# Patient Record
Sex: Male | Born: 1960 | Race: Black or African American | Hispanic: No | Marital: Single | Smoking: Never smoker
Health system: Southern US, Community
[De-identification: ages and names within clinical notes are randomized; demographics above are authoritative.]

## PROBLEM LIST (undated history)

## (undated) DIAGNOSIS — N186 End stage renal disease: Secondary | ICD-10-CM

## (undated) DIAGNOSIS — J9610 Chronic respiratory failure, unspecified whether with hypoxia or hypercapnia: Secondary | ICD-10-CM

## (undated) DIAGNOSIS — U071 COVID-19: Secondary | ICD-10-CM

## (undated) DIAGNOSIS — J1282 Pneumonia due to coronavirus disease 2019: Secondary | ICD-10-CM

## (undated) DIAGNOSIS — G4733 Obstructive sleep apnea (adult) (pediatric): Secondary | ICD-10-CM

## (undated) DIAGNOSIS — D649 Anemia, unspecified: Secondary | ICD-10-CM

## (undated) DIAGNOSIS — E119 Type 2 diabetes mellitus without complications: Secondary | ICD-10-CM

## (undated) DIAGNOSIS — M109 Gout, unspecified: Secondary | ICD-10-CM

## (undated) DIAGNOSIS — N189 Chronic kidney disease, unspecified: Secondary | ICD-10-CM

## (undated) DIAGNOSIS — I10 Essential (primary) hypertension: Secondary | ICD-10-CM

## (undated) DIAGNOSIS — E785 Hyperlipidemia, unspecified: Secondary | ICD-10-CM

## (undated) HISTORY — DX: Gout, unspecified: M10.9

## (undated) HISTORY — DX: Obstructive sleep apnea (adult) (pediatric): G47.33

## (undated) HISTORY — DX: Hyperlipidemia, unspecified: E78.5

## (undated) HISTORY — DX: Morbid (severe) obesity due to excess calories: E66.01

## (undated) HISTORY — DX: Chronic kidney disease, unspecified: N18.9

## (undated) HISTORY — PX: AV FISTULA INSERTION W/ RF MAGNETIC GUIDANCE: CATH118308

## (undated) HISTORY — DX: Type 2 diabetes mellitus without complications: E11.9

## (undated) HISTORY — DX: Essential (primary) hypertension: I10

---

## 2013-04-26 DIAGNOSIS — I509 Heart failure, unspecified: Secondary | ICD-10-CM

## 2017-06-04 ENCOUNTER — Other Ambulatory Visit: Payer: Self-pay

## 2017-06-04 DIAGNOSIS — T82510S Breakdown (mechanical) of surgically created arteriovenous fistula, sequela: Secondary | ICD-10-CM

## 2017-06-05 ENCOUNTER — Encounter: Payer: Self-pay | Admitting: Vascular Surgery

## 2017-06-30 ENCOUNTER — Encounter: Payer: Self-pay | Admitting: Vascular Surgery

## 2017-07-08 ENCOUNTER — Encounter (HOSPITAL_COMMUNITY): Payer: Medicare Other

## 2017-07-08 ENCOUNTER — Encounter: Payer: Non-veteran care | Admitting: Vascular Surgery

## 2019-12-06 ENCOUNTER — Inpatient Hospital Stay (HOSPITAL_COMMUNITY)
Admission: EM | Admit: 2019-12-06 | Discharge: 2019-12-11 | DRG: 177 | Disposition: A | Discharge status: Home / Self Care | Payer: No Typology Code available for payment source | Attending: Internal Medicine | Admitting: Internal Medicine

## 2019-12-06 ENCOUNTER — Encounter (HOSPITAL_COMMUNITY): Payer: Self-pay | Admitting: Emergency Medicine

## 2019-12-06 ENCOUNTER — Other Ambulatory Visit: Payer: Self-pay

## 2019-12-06 ENCOUNTER — Emergency Department (HOSPITAL_COMMUNITY): Payer: No Typology Code available for payment source

## 2019-12-06 DIAGNOSIS — Z841 Family history of disorders of kidney and ureter: Secondary | ICD-10-CM

## 2019-12-06 DIAGNOSIS — N186 End stage renal disease: Secondary | ICD-10-CM

## 2019-12-06 DIAGNOSIS — I10 Essential (primary) hypertension: Secondary | ICD-10-CM | POA: Diagnosis present

## 2019-12-06 DIAGNOSIS — N2581 Secondary hyperparathyroidism of renal origin: Secondary | ICD-10-CM | POA: Diagnosis present

## 2019-12-06 DIAGNOSIS — E871 Hypo-osmolality and hyponatremia: Secondary | ICD-10-CM | POA: Diagnosis present

## 2019-12-06 DIAGNOSIS — M109 Gout, unspecified: Secondary | ICD-10-CM | POA: Diagnosis present

## 2019-12-06 DIAGNOSIS — I4510 Unspecified right bundle-branch block: Secondary | ICD-10-CM | POA: Diagnosis present

## 2019-12-06 DIAGNOSIS — Z992 Dependence on renal dialysis: Secondary | ICD-10-CM

## 2019-12-06 DIAGNOSIS — Z8249 Family history of ischemic heart disease and other diseases of the circulatory system: Secondary | ICD-10-CM

## 2019-12-06 DIAGNOSIS — G4733 Obstructive sleep apnea (adult) (pediatric): Secondary | ICD-10-CM | POA: Diagnosis present

## 2019-12-06 DIAGNOSIS — E119 Type 2 diabetes mellitus without complications: Secondary | ICD-10-CM

## 2019-12-06 DIAGNOSIS — R0602 Shortness of breath: Secondary | ICD-10-CM

## 2019-12-06 DIAGNOSIS — M1A9XX0 Chronic gout, unspecified, without tophus (tophi): Secondary | ICD-10-CM | POA: Diagnosis present

## 2019-12-06 DIAGNOSIS — Z9981 Dependence on supplemental oxygen: Secondary | ICD-10-CM

## 2019-12-06 DIAGNOSIS — Z794 Long term (current) use of insulin: Secondary | ICD-10-CM

## 2019-12-06 DIAGNOSIS — R0603 Acute respiratory distress: Secondary | ICD-10-CM

## 2019-12-06 DIAGNOSIS — D72819 Decreased white blood cell count, unspecified: Secondary | ICD-10-CM | POA: Diagnosis present

## 2019-12-06 DIAGNOSIS — E782 Mixed hyperlipidemia: Secondary | ICD-10-CM | POA: Diagnosis present

## 2019-12-06 DIAGNOSIS — D649 Anemia, unspecified: Secondary | ICD-10-CM | POA: Diagnosis present

## 2019-12-06 DIAGNOSIS — J1282 Pneumonia due to coronavirus disease 2019: Secondary | ICD-10-CM | POA: Diagnosis present

## 2019-12-06 DIAGNOSIS — D631 Anemia in chronic kidney disease: Secondary | ICD-10-CM | POA: Diagnosis present

## 2019-12-06 DIAGNOSIS — Z6841 Body Mass Index (BMI) 40.0 and over, adult: Secondary | ICD-10-CM

## 2019-12-06 DIAGNOSIS — U071 COVID-19: Principal | ICD-10-CM | POA: Diagnosis present

## 2019-12-06 DIAGNOSIS — J9601 Acute respiratory failure with hypoxia: Secondary | ICD-10-CM | POA: Diagnosis present

## 2019-12-06 DIAGNOSIS — E1122 Type 2 diabetes mellitus with diabetic chronic kidney disease: Secondary | ICD-10-CM | POA: Diagnosis present

## 2019-12-06 DIAGNOSIS — Z79899 Other long term (current) drug therapy: Secondary | ICD-10-CM

## 2019-12-06 DIAGNOSIS — R6510 Systemic inflammatory response syndrome (SIRS) of non-infectious origin without acute organ dysfunction: Secondary | ICD-10-CM | POA: Diagnosis present

## 2019-12-06 DIAGNOSIS — I959 Hypotension, unspecified: Secondary | ICD-10-CM | POA: Diagnosis not present

## 2019-12-06 DIAGNOSIS — I1311 Hypertensive heart and chronic kidney disease without heart failure, with stage 5 chronic kidney disease, or end stage renal disease: Secondary | ICD-10-CM | POA: Diagnosis present

## 2019-12-06 DIAGNOSIS — E785 Hyperlipidemia, unspecified: Secondary | ICD-10-CM | POA: Diagnosis present

## 2019-12-06 DIAGNOSIS — J9621 Acute and chronic respiratory failure with hypoxia: Secondary | ICD-10-CM | POA: Diagnosis present

## 2019-12-06 DIAGNOSIS — E559 Vitamin D deficiency, unspecified: Secondary | ICD-10-CM | POA: Diagnosis present

## 2019-12-06 LAB — CBC WITH DIFFERENTIAL/PLATELET
Abs Immature Granulocytes: 0.02 10*3/uL (ref 0.00–0.07)
Basophils Absolute: 0 10*3/uL (ref 0.0–0.1)
Basophils Relative: 0 %
Eosinophils Absolute: 0 10*3/uL (ref 0.0–0.5)
Eosinophils Relative: 0 %
HCT: 34.5 % — ABNORMAL LOW (ref 39.0–52.0)
Hemoglobin: 11.4 g/dL — ABNORMAL LOW (ref 13.0–17.0)
Immature Granulocytes: 1 %
Lymphocytes Relative: 12 %
Lymphs Abs: 0.4 10*3/uL — ABNORMAL LOW (ref 0.7–4.0)
MCH: 32 pg (ref 26.0–34.0)
MCHC: 33 g/dL (ref 30.0–36.0)
MCV: 96.9 fL (ref 80.0–100.0)
Monocytes Absolute: 0.5 10*3/uL (ref 0.1–1.0)
Monocytes Relative: 16 %
Neutro Abs: 2.2 10*3/uL (ref 1.7–7.7)
Neutrophils Relative %: 71 %
Platelets: 241 10*3/uL (ref 150–400)
RBC: 3.56 MIL/uL — ABNORMAL LOW (ref 4.22–5.81)
RDW: 15.3 % (ref 11.5–15.5)
WBC: 3.1 10*3/uL — ABNORMAL LOW (ref 4.0–10.5)
nRBC: 0 % (ref 0.0–0.2)

## 2019-12-06 LAB — COMPREHENSIVE METABOLIC PANEL
ALT: 21 U/L (ref 0–44)
AST: 34 U/L (ref 15–41)
Albumin: 3.5 g/dL (ref 3.5–5.0)
Alkaline Phosphatase: 56 U/L (ref 38–126)
Anion gap: 20 — ABNORMAL HIGH (ref 5–15)
BUN: 98 mg/dL — ABNORMAL HIGH (ref 6–20)
CO2: 21 mmol/L — ABNORMAL LOW (ref 22–32)
Calcium: 7.9 mg/dL — ABNORMAL LOW (ref 8.9–10.3)
Chloride: 87 mmol/L — ABNORMAL LOW (ref 98–111)
Creatinine, Ser: 16.9 mg/dL — ABNORMAL HIGH (ref 0.61–1.24)
GFR calc Af Amer: 3 mL/min — ABNORMAL LOW (ref >60)
GFR calc non Af Amer: 3 mL/min — ABNORMAL LOW (ref >60)
Glucose, Bld: 135 mg/dL — ABNORMAL HIGH (ref 70–99)
Potassium: 5 mmol/L (ref 3.5–5.1)
Sodium: 128 mmol/L — ABNORMAL LOW (ref 135–145)
Total Bilirubin: 0.7 mg/dL (ref 0.3–1.2)
Total Protein: 7.8 g/dL (ref 6.5–8.1)

## 2019-12-06 LAB — PROCALCITONIN: Procalcitonin: 11.61 ng/mL

## 2019-12-06 LAB — TRIGLYCERIDES: Triglycerides: 193 mg/dL — ABNORMAL HIGH (ref <150)

## 2019-12-06 LAB — LACTATE DEHYDROGENASE: LDH: 244 U/L — ABNORMAL HIGH (ref 98–192)

## 2019-12-06 LAB — FIBRINOGEN: Fibrinogen: 513 mg/dL — ABNORMAL HIGH (ref 210–475)

## 2019-12-06 LAB — LACTIC ACID, PLASMA: Lactic Acid, Venous: 1.3 mmol/L (ref 0.5–1.9)

## 2019-12-06 LAB — D-DIMER, QUANTITATIVE: D-Dimer, Quant: 0.7 ug/mL-FEU — ABNORMAL HIGH (ref 0.00–0.50)

## 2019-12-06 MED ORDER — PIPERACILLIN-TAZOBACTAM 3.375 G IVPB 30 MIN
3.3750 g | Freq: Once | INTRAVENOUS | Status: AC
  Administered 2019-12-06: 3.375 g via INTRAVENOUS
  Filled 2019-12-06: qty 50

## 2019-12-06 MED ORDER — VANCOMYCIN HCL IN DEXTROSE 1-5 GM/200ML-% IV SOLN
1000.0000 mg | Freq: Once | INTRAVENOUS | Status: AC
  Administered 2019-12-07: 01:00:00 1000 mg via INTRAVENOUS
  Filled 2019-12-06: qty 200

## 2019-12-06 NOTE — ED Triage Notes (Signed)
Pt c/o sob x one week. Pt's last dialysis was Saturday.

## 2019-12-06 NOTE — ED Provider Notes (Signed)
[facility] EMERGENCY DEPARTMENT Provider Note   CSN: [id number] Arrival date & time: 12/06/19  2020     History Chief Complaint  Patient presents with  . Shortness of Breath    Jesus Williams is a 58 y.o. male.  HPI   This patient is a 58 year old male, he is a diabetic with chronic kidney disease on dialysis Monday Wednesdays and Fridays.  He presents with a cough and some shortness of breath, noted to be febrile, hypotensive and tachycardic.  He states that he goes to dialysis, one of the family members at home has had a upper respiratory infection which she calls a cold with sneezing coughing and runny nose.  His symptoms have been persistent, gradually worsening, he missed dialysis today because he was not feeling well and because he "ran out of gas", literally.  He has not been evaluated for this complaint prior to today.  Past Medical History:  Diagnosis Date  . Chronic kidney disease    chronic  . Diabetes mellitus without complication (HCC)    Type 2  . Gout   . Hyperlipidemia    mixed. Fair control with current meds.  . Hypertension    Essential  . OSA (obstructive sleep apnea)   . Severe obesity ([place])    Pt followed by a [facility].    There are no problems to display for this patient.   Past Surgical History:  Procedure Laterality Date  . AV FISTULA INSERTION W/ RF MAGNETIC GUIDANCE         No family history on file.  Social History   Tobacco Use  . Smoking status: Never Smoker  . Smokeless tobacco: Never Used  Substance Use Topics  . Alcohol use: Not Currently  . Drug use: Not Currently    Home Medications Prior to Admission medications   Medication Sig Start Date End Date Taking? Authorizing Provider  allopurinol (ZYLOPRIM) 100 MG tablet Take 100 mg by mouth daily.    [provider]  calcium carbonate (TUMS EX) 750 MG chewable tablet Chew 1 tablet by mouth daily.    [provider]  carvedilol (COREG) 25 MG  tablet Take 25 mg by mouth 2 (two) times daily with a meal.    [provider]  cloNIDine (CATAPRES) 0.2 MG tablet Take 0.2 mg by mouth 2 (two) times daily.    [provider]  glipiZIDE (GLUCOTROL) 5 MG tablet Take by mouth daily before breakfast.    [provider]  hydrALAZINE (APRESOLINE) 50 MG tablet Take 50 mg by mouth 3 (three) times daily.    [provider]  insulin aspart (NOVOLOG) 100 UNIT/ML injection Inject into the skin 3 (three) times daily before meals.    [provider]  Ipratropium-Albuterol (COMBIVENT) 20-100 MCG/ACT AERS respimat Inhale 1 puff into the lungs every 6 (six) hours.    [provider]  lidocaine-prilocaine (EMLA) cream Apply 1 application topically as needed. Apply 30 minutes prior to dialysis.    [provider]  nitroGLYCERIN (NITROLINGUAL) 0.4 MG/SPRAY spray Place 1 spray under the tongue every 5 (five) minutes x 3 doses as needed for chest pain.    [provider]  OXYGEN Inhale 2-4 L into the lungs daily as needed.    [provider]  sevelamer carbonate (RENVELA) 800 MG tablet Take 800 mg by mouth 3 (three) times daily with meals.    [provider]  terazosin (HYTRIN) 10 MG capsule Take 10 mg by mouth  at bedtime.    [provider]  Vitamin D, Ergocalciferol, (DRISDOL) 50000 units CAPS capsule Take 50,000 Units by mouth every 7 (seven) days.    [provider]    Allergies    Patient has no allergy information on record.  Review of Systems   Review of Systems  All other systems reviewed and are negative.   Physical Exam Updated Vital Signs BP (!) 89/63 (BP Location: Right Arm)   Pulse (!) 103   Temp 100.2 F (37.9 C) (Oral)   Resp 20   Ht 1.702 m (5\' 7" )   Wt 134.7 kg   SpO2 93%   BMI 46.52 kg/m   Physical Exam Vitals and nursing note reviewed.  Constitutional:      General: He is not in acute distress.    Appearance: He is  well-developed.  HENT:     Head: Normocephalic and atraumatic.     Mouth/Throat:     Pharynx: No oropharyngeal exudate.  Eyes:     General: No scleral icterus.       Right eye: No discharge.        Left eye: No discharge.     Conjunctiva/sclera: Conjunctivae normal.     Pupils: Pupils are equal, round, and reactive to light.  Neck:     Thyroid: No thyromegaly.     Vascular: No JVD.  Cardiovascular:     Rate and Rhythm: Regular rhythm. Tachycardia present.     Heart sounds: Normal heart sounds. No murmur. No friction rub. No gallop.   Pulmonary:     Effort: Pulmonary effort is normal. No respiratory distress.     Breath sounds: Normal breath sounds. No wheezing or rales.  Abdominal:     General: Bowel sounds are normal. There is no distension.     Palpations: Abdomen is soft. There is no mass.     Tenderness: There is no abdominal tenderness.  Musculoskeletal:        General: No tenderness. Normal range of motion.     Cervical back: Normal range of motion and neck supple.  Lymphadenopathy:     Cervical: No cervical adenopathy.  Skin:    General: Skin is warm and dry.     Findings: No erythema or rash.  Neurological:     Mental Status: He is alert.     Coordination: Coordination normal.  Psychiatric:        Behavior: Behavior normal.     ED Results / Procedures / Treatments   Labs (all labs ordered are listed, but only abnormal results are displayed) Labs Reviewed  CBC WITH DIFFERENTIAL/PLATELET - Abnormal; Notable for the following components:      Result Value   WBC 3.1 (*)    RBC 3.56 (*)    Hemoglobin 11.4 (*)    HCT 34.5 (*)    Lymphs Abs 0.4 (*)    All other components within normal limits  COMPREHENSIVE METABOLIC PANEL - Abnormal; Notable for the following components:   Sodium 128 (*)    Chloride 87 (*)    CO2 21 (*)    Glucose, Bld 135 (*)    BUN 98 (*)    Creatinine, Ser 16.90 (*)    Calcium 7.9 (*)    GFR calc non Af Amer 3 (*)    GFR calc Af Amer  3 (*)    Anion gap 20 (*)    All other components within normal limits  D-DIMER, QUANTITATIVE (NOT AT [facility]) - Abnormal;  Notable for the following components:   D-Dimer, Quant 0.70 (*)    All other components within normal limits  LACTATE DEHYDROGENASE - Abnormal; Notable for the following components:   LDH 244 (*)    All other components within normal limits  TRIGLYCERIDES - Abnormal; Notable for the following components:   Triglycerides 193 (*)    All other components within normal limits  FIBRINOGEN - Abnormal; Notable for the following components:   Fibrinogen 513 (*)    All other components within normal limits  SARS CORONAVIRUS 2 (TAT 6-24 HRS)  CULTURE, BLOOD (ROUTINE X 2)  CULTURE, BLOOD (ROUTINE X 2)  LACTIC ACID, PLASMA  LACTIC ACID, PLASMA  PROCALCITONIN  FERRITIN  C-REACTIVE PROTEIN  POC SARS CORONAVIRUS 2 AG -  ED    EKG EKG Interpretation  Date/Time:  Monday December 06 2019 21:08:39 EST Ventricular Rate:  102 PR Interval:  128 QRS Duration: 150 QT Interval:  394 QTC Calculation: 513 R Axis:   166 Text Interpretation: Sinus tachycardia Right bundle branch block T wave abnormality, consider inferior ischemia Abnormal ECG No old tracing to compare Confirmed by Noemi Chapel [phone]) on 12/06/2019 9:54:21 PM   Radiology DG Chest Port 1 View  Result Date: 12/06/2019 CLINICAL DATA:  Shortness of breath EXAM: PORTABLE CHEST 1 VIEW COMPARISON:  04/23/2013 FINDINGS: Cardiomegaly with mild central congestion. No pleural effusion or overt pulmonary edema. No pneumothorax. IMPRESSION: Cardiomegaly with mild central congestion. No definite focal airspace disease. Electronically Signed   By: Donavan Foil M.D.   On: 12/06/2019 22:39    Procedures Procedures (including critical care time)  Medications Ordered in ED Medications  vancomycin (VANCOCIN) IVPB 1000 mg/200 mL premix (has no administration in time range)  piperacillin-tazobactam (ZOSYN) IVPB 3.375 g (has no  administration in time range)    ED Course  I have reviewed the triage vital signs and the nursing notes.  Pertinent labs & imaging results that were available during my care of the patient were reviewed by me and considered in my medical decision making (see chart for details).    MDM Rules/Calculators/A&P                      On my exam the patient's blood pressure has improved slightly, his heart rate is just above 100 and his temperature is 100.2 with an oxygen that was 88% on room air.  He will need have a chest x-ray, Covid test, labs, EKG, further evaluation for the cause of this possible pneumonia, would also consider coronavirus, pneumothorax or other causes of symptoms as well.  Patient agreeable to the plan.  The patient's blood pressure was rechecked, it is still low at 89/63, the map is over 60.  His temperature is 100.2, pulse of 103, lactic acid 1.3 and a white blood cell count that is slightly low at 3.1.  His chest x-ray does not reveal any focal infiltrates.  He states he no longer makes urine.  His coronavirus test is pending, I suspect that he is at risk for sepsis due to frequent blood draws and accessing his vascular system through dialysis and thus he will be treated for possible bacteremia and sepsis.  At this time the patient's care will be transitioned to Dr. Roxanne Mins to formally consult once coronavirus test is come back.  The patient will need to be admitted for the confluence of symptoms above.  COCHISE WESP was evaluated in Emergency Department on 12/07/2019 for the symptoms described  in the history of present illness. He was evaluated in the context of the global COVID-19 pandemic, which necessitated consideration that the patient might be at risk for infection with the SARS-CoV-2 virus that causes COVID-19. Institutional protocols and algorithms that pertain to the evaluation of patients at risk for COVID-19 are in a state of rapid change based on information  released by regulatory bodies including the CDC and federal and state organizations. These policies and algorithms were followed during the patient's care in the ED.   Final Clinical Impression(s) / ED Diagnoses Final diagnoses:  Acute respiratory distress      Noemi Chapel, MD 12/07/19 1456

## 2019-12-07 ENCOUNTER — Encounter (HOSPITAL_COMMUNITY): Payer: Self-pay | Admitting: Internal Medicine

## 2019-12-07 ENCOUNTER — Observation Stay (HOSPITAL_COMMUNITY): Payer: No Typology Code available for payment source

## 2019-12-07 DIAGNOSIS — Z6841 Body Mass Index (BMI) 40.0 and over, adult: Secondary | ICD-10-CM | POA: Diagnosis not present

## 2019-12-07 DIAGNOSIS — Z794 Long term (current) use of insulin: Secondary | ICD-10-CM | POA: Diagnosis not present

## 2019-12-07 DIAGNOSIS — Z9981 Dependence on supplemental oxygen: Secondary | ICD-10-CM | POA: Diagnosis not present

## 2019-12-07 DIAGNOSIS — Z841 Family history of disorders of kidney and ureter: Secondary | ICD-10-CM | POA: Diagnosis not present

## 2019-12-07 DIAGNOSIS — M1A9XX0 Chronic gout, unspecified, without tophus (tophi): Secondary | ICD-10-CM | POA: Diagnosis present

## 2019-12-07 DIAGNOSIS — E871 Hypo-osmolality and hyponatremia: Secondary | ICD-10-CM | POA: Diagnosis present

## 2019-12-07 DIAGNOSIS — Z992 Dependence on renal dialysis: Secondary | ICD-10-CM | POA: Diagnosis not present

## 2019-12-07 DIAGNOSIS — D631 Anemia in chronic kidney disease: Secondary | ICD-10-CM | POA: Diagnosis present

## 2019-12-07 DIAGNOSIS — J1282 Pneumonia due to coronavirus disease 2019: Secondary | ICD-10-CM | POA: Diagnosis present

## 2019-12-07 DIAGNOSIS — R0603 Acute respiratory distress: Secondary | ICD-10-CM | POA: Diagnosis not present

## 2019-12-07 DIAGNOSIS — E785 Hyperlipidemia, unspecified: Secondary | ICD-10-CM | POA: Diagnosis present

## 2019-12-07 DIAGNOSIS — N186 End stage renal disease: Secondary | ICD-10-CM | POA: Diagnosis present

## 2019-12-07 DIAGNOSIS — D72819 Decreased white blood cell count, unspecified: Secondary | ICD-10-CM | POA: Diagnosis present

## 2019-12-07 DIAGNOSIS — I4510 Unspecified right bundle-branch block: Secondary | ICD-10-CM | POA: Diagnosis present

## 2019-12-07 DIAGNOSIS — N2581 Secondary hyperparathyroidism of renal origin: Secondary | ICD-10-CM | POA: Diagnosis present

## 2019-12-07 DIAGNOSIS — E1122 Type 2 diabetes mellitus with diabetic chronic kidney disease: Secondary | ICD-10-CM | POA: Diagnosis present

## 2019-12-07 DIAGNOSIS — E559 Vitamin D deficiency, unspecified: Secondary | ICD-10-CM | POA: Diagnosis present

## 2019-12-07 DIAGNOSIS — U071 COVID-19: Secondary | ICD-10-CM | POA: Diagnosis present

## 2019-12-07 DIAGNOSIS — I959 Hypotension, unspecified: Secondary | ICD-10-CM | POA: Diagnosis not present

## 2019-12-07 DIAGNOSIS — Z79899 Other long term (current) drug therapy: Secondary | ICD-10-CM | POA: Diagnosis not present

## 2019-12-07 DIAGNOSIS — G4733 Obstructive sleep apnea (adult) (pediatric): Secondary | ICD-10-CM | POA: Diagnosis present

## 2019-12-07 DIAGNOSIS — Z8249 Family history of ischemic heart disease and other diseases of the circulatory system: Secondary | ICD-10-CM | POA: Diagnosis not present

## 2019-12-07 DIAGNOSIS — I1311 Hypertensive heart and chronic kidney disease without heart failure, with stage 5 chronic kidney disease, or end stage renal disease: Secondary | ICD-10-CM | POA: Diagnosis present

## 2019-12-07 DIAGNOSIS — R0602 Shortness of breath: Secondary | ICD-10-CM | POA: Diagnosis present

## 2019-12-07 DIAGNOSIS — I10 Essential (primary) hypertension: Secondary | ICD-10-CM | POA: Diagnosis not present

## 2019-12-07 DIAGNOSIS — J9621 Acute and chronic respiratory failure with hypoxia: Secondary | ICD-10-CM | POA: Diagnosis present

## 2019-12-07 DIAGNOSIS — R6510 Systemic inflammatory response syndrome (SIRS) of non-infectious origin without acute organ dysfunction: Secondary | ICD-10-CM | POA: Diagnosis not present

## 2019-12-07 DIAGNOSIS — J9601 Acute respiratory failure with hypoxia: Secondary | ICD-10-CM | POA: Diagnosis present

## 2019-12-07 DIAGNOSIS — D649 Anemia, unspecified: Secondary | ICD-10-CM | POA: Diagnosis present

## 2019-12-07 DIAGNOSIS — E782 Mixed hyperlipidemia: Secondary | ICD-10-CM | POA: Diagnosis present

## 2019-12-07 DIAGNOSIS — M109 Gout, unspecified: Secondary | ICD-10-CM | POA: Diagnosis present

## 2019-12-07 DIAGNOSIS — E119 Type 2 diabetes mellitus without complications: Secondary | ICD-10-CM

## 2019-12-07 LAB — CBC
HCT: 34.9 % — ABNORMAL LOW (ref 39.0–52.0)
Hemoglobin: 11.6 g/dL — ABNORMAL LOW (ref 13.0–17.0)
MCH: 32.5 pg (ref 26.0–34.0)
MCHC: 33.2 g/dL (ref 30.0–36.0)
MCV: 97.8 fL (ref 80.0–100.0)
Platelets: 224 10*3/uL (ref 150–400)
RBC: 3.57 MIL/uL — ABNORMAL LOW (ref 4.22–5.81)
RDW: 15.3 % (ref 11.5–15.5)
WBC: 2.8 10*3/uL — ABNORMAL LOW (ref 4.0–10.5)
nRBC: 0 % (ref 0.0–0.2)

## 2019-12-07 LAB — CBG MONITORING, ED
Glucose-Capillary: 105 mg/dL — ABNORMAL HIGH (ref 70–99)
Glucose-Capillary: 156 mg/dL — ABNORMAL HIGH (ref 70–99)
Glucose-Capillary: 184 mg/dL — ABNORMAL HIGH (ref 70–99)

## 2019-12-07 LAB — RESPIRATORY PANEL BY RT PCR (FLU A&B, COVID)
Influenza A by PCR: NEGATIVE
Influenza B by PCR: NEGATIVE
SARS Coronavirus 2 by RT PCR: POSITIVE — AB

## 2019-12-07 LAB — POC SARS CORONAVIRUS 2 AG -  ED: SARS Coronavirus 2 Ag: NEGATIVE

## 2019-12-07 LAB — SARS CORONAVIRUS 2 (TAT 6-24 HRS): SARS Coronavirus 2: POSITIVE — AB

## 2019-12-07 LAB — BASIC METABOLIC PANEL
BUN: 102 mg/dL — ABNORMAL HIGH (ref 6–20)
CO2: 18 mmol/L — ABNORMAL LOW (ref 22–32)
Calcium: 7.8 mg/dL — ABNORMAL LOW (ref 8.9–10.3)
Chloride: 88 mmol/L — ABNORMAL LOW (ref 98–111)
Creatinine, Ser: 17.38 mg/dL — ABNORMAL HIGH (ref 0.61–1.24)
GFR calc Af Amer: 3 mL/min — ABNORMAL LOW (ref >60)
GFR calc non Af Amer: 3 mL/min — ABNORMAL LOW (ref >60)
Glucose, Bld: 136 mg/dL — ABNORMAL HIGH (ref 70–99)
Potassium: 4.9 mmol/L (ref 3.5–5.1)
Sodium: 130 mmol/L — ABNORMAL LOW (ref 135–145)

## 2019-12-07 LAB — HEMOGLOBIN A1C
Hgb A1c MFr Bld: 7 % — ABNORMAL HIGH (ref 4.8–5.6)
Mean Plasma Glucose: 154.2 mg/dL

## 2019-12-07 LAB — FERRITIN: Ferritin: 1635 ng/mL — ABNORMAL HIGH (ref 24–336)

## 2019-12-07 LAB — ABO/RH: ABO/RH(D): O POS

## 2019-12-07 LAB — GLUCOSE, CAPILLARY: Glucose-Capillary: 219 mg/dL — ABNORMAL HIGH (ref 70–99)

## 2019-12-07 LAB — C-REACTIVE PROTEIN: CRP: 10.1 mg/dL — ABNORMAL HIGH (ref <1.0)

## 2019-12-07 LAB — HIV ANTIBODY (ROUTINE TESTING W REFLEX): HIV Screen 4th Generation wRfx: NONREACTIVE

## 2019-12-07 MED ORDER — ENOXAPARIN SODIUM 30 MG/0.3ML ~~LOC~~ SOLN
30.0000 mg | SUBCUTANEOUS | Status: DC
  Administered 2019-12-07: 09:00:00 30 mg via SUBCUTANEOUS
  Filled 2019-12-07 (×2): qty 0.3

## 2019-12-07 MED ORDER — ONDANSETRON HCL 4 MG PO TABS: 4.0000 mg | ORAL_TABLET | Freq: Four times a day (QID) | ORAL | Status: DC | PRN

## 2019-12-07 MED ORDER — PIPERACILLIN-TAZOBACTAM IN DEX 2-0.25 GM/50ML IV SOLN
2.2500 g | Freq: Three times a day (TID) | INTRAVENOUS | Status: DC
  Administered 2019-12-07: 09:00:00 2.25 g via INTRAVENOUS
  Filled 2019-12-07 (×3): qty 50

## 2019-12-07 MED ORDER — ACETAMINOPHEN 325 MG PO TABS: 650.0000 mg | ORAL_TABLET | Freq: Four times a day (QID) | ORAL | Status: DC | PRN

## 2019-12-07 MED ORDER — ACETAMINOPHEN 650 MG RE SUPP: 650.0000 mg | Freq: Four times a day (QID) | RECTAL | Status: DC | PRN

## 2019-12-07 MED ORDER — VANCOMYCIN HCL IN DEXTROSE 1-5 GM/200ML-% IV SOLN
1000.0000 mg | Freq: Once | INTRAVENOUS | Status: AC
  Administered 2019-12-07: 17:00:00 1000 mg via INTRAVENOUS
  Filled 2019-12-07: qty 200

## 2019-12-07 MED ORDER — VANCOMYCIN HCL IN DEXTROSE 1-5 GM/200ML-% IV SOLN
1000.0000 mg | INTRAVENOUS | Status: DC
  Administered 2019-12-08: 18:00:00 1000 mg via INTRAVENOUS
  Filled 2019-12-07: qty 200

## 2019-12-07 MED ORDER — DOXERCALCIFEROL 4 MCG/2ML IV SOLN
3.5000 ug | INTRAVENOUS | Status: DC
  Administered 2019-12-08: 16:00:00 3.5 ug via INTRAVENOUS
  Filled 2019-12-07 (×2): qty 2

## 2019-12-07 MED ORDER — GUAIFENESIN-DM 100-10 MG/5ML PO SYRP: 10.0000 mL | ORAL_SOLUTION | ORAL | Status: DC | PRN

## 2019-12-07 MED ORDER — ASCORBIC ACID 500 MG PO TABS
500.0000 mg | ORAL_TABLET | Freq: Every day | ORAL | Status: DC
  Administered 2019-12-07 – 2019-12-11 (×5): 500 mg via ORAL
  Filled 2019-12-07 (×5): qty 1

## 2019-12-07 MED ORDER — ALBUTEROL SULFATE HFA 108 (90 BASE) MCG/ACT IN AERS
2.0000 | INHALATION_SPRAY | Freq: Four times a day (QID) | RESPIRATORY_TRACT | Status: DC
  Administered 2019-12-07 – 2019-12-10 (×13): 2 via RESPIRATORY_TRACT
  Filled 2019-12-07: qty 6.7

## 2019-12-07 MED ORDER — SODIUM CHLORIDE 0.9 % IV SOLN
200.0000 mg | Freq: Once | INTRAVENOUS | Status: AC
  Administered 2019-12-07: 16:00:00 200 mg via INTRAVENOUS
  Filled 2019-12-07: qty 40

## 2019-12-07 MED ORDER — ONDANSETRON HCL 4 MG/2ML IJ SOLN: 4.0000 mg | Freq: Four times a day (QID) | INTRAMUSCULAR | Status: DC | PRN

## 2019-12-07 MED ORDER — SODIUM CHLORIDE 0.9 % IV SOLN
100.0000 mg | Freq: Every day | INTRAVENOUS | Status: DC
  Administered 2019-12-08 – 2019-12-10 (×2): 100 mg via INTRAVENOUS
  Filled 2019-12-07 (×2): qty 20
  Filled 2019-12-07: qty 100
  Filled 2019-12-07: qty 20

## 2019-12-07 MED ORDER — PIPERACILLIN-TAZOBACTAM 3.375 G IVPB
3.3750 g | Freq: Two times a day (BID) | INTRAVENOUS | Status: DC
  Administered 2019-12-07 – 2019-12-09 (×5): 3.375 g via INTRAVENOUS
  Filled 2019-12-07 (×5): qty 50

## 2019-12-07 MED ORDER — CHLORHEXIDINE GLUCONATE CLOTH 2 % EX PADS
6.0000 | MEDICATED_PAD | Freq: Every day | CUTANEOUS | Status: DC
  Administered 2019-12-10 – 2019-12-11 (×2): 6 via TOPICAL

## 2019-12-07 MED ORDER — VANCOMYCIN VARIABLE DOSE PER UNSTABLE RENAL FUNCTION (PHARMACIST DOSING): Status: DC

## 2019-12-07 MED ORDER — VANCOMYCIN HCL 750 MG/150ML IV SOLN
750.0000 mg | Freq: Once | INTRAVENOUS | Status: AC
  Administered 2019-12-07: 05:00:00 750 mg via INTRAVENOUS
  Filled 2019-12-07 (×2): qty 150

## 2019-12-07 MED ORDER — ZINC SULFATE 220 (50 ZN) MG PO CAPS
220.0000 mg | ORAL_CAPSULE | Freq: Every day | ORAL | Status: DC
  Administered 2019-12-07 – 2019-12-11 (×5): 220 mg via ORAL
  Filled 2019-12-07 (×5): qty 1

## 2019-12-07 MED ORDER — ORAL CARE MOUTH RINSE
15.0000 mL | Freq: Two times a day (BID) | OROMUCOSAL | Status: DC
  Administered 2019-12-08 – 2019-12-10 (×6): 15 mL via OROMUCOSAL

## 2019-12-07 MED ORDER — DEXAMETHASONE SODIUM PHOSPHATE 10 MG/ML IJ SOLN
6.0000 mg | INTRAMUSCULAR | Status: DC
  Administered 2019-12-07 – 2019-12-10 (×4): 6 mg via INTRAVENOUS
  Filled 2019-12-07 (×4): qty 1

## 2019-12-07 NOTE — Procedures (Signed)
    NEPHROLOGY NURSING NOTE:  Due to high patient census, hemodialysis orders for 12/29 have been suspended by Dr. Johnney Ou.  Dr. Augustin Coupe will round on 12/30 and adjust HD orders at that time.  Rockwell Alexandria, RN

## 2019-12-07 NOTE — Progress Notes (Signed)
Patient has had recent fall at home. Advised patient of need for bed alarm and for patient to call if he needs assistance out of the bed. Patient refusing bed alarm at this time and implies that there is only a possibility that he will call before getting out of bed. Fall socks on patient. Call light in place.

## 2019-12-07 NOTE — Progress Notes (Addendum)
Pharmacy Antibiotic Note  SHARMAN FAIRBAIRN is a 58 y.o. male admitted on 12/06/2019 with SIRS.  Pharmacy has been consulted for vancomycin and zosyn dosing. Vancomycin 1gm and zosyn 3.375 gm ordered in ED  Plan: Vanc 1.75g IV x 1 dose Vanco 1000 mg IV every MWF after dialysis (additional 1g added for 12/29 due to extra dialysis session) Zosyn 3.375g IV every 12 hours. Monitor labs, c/s, and vanco level as indicated.  Height: 5\' 7"  (170.2 cm) Weight: 297 lb (134.7 kg) IBW/kg (Calculated) : 66.1  Temp (24hrs), Avg:100.2 F (37.9 C), Min:100.2 F (37.9 C), Max:100.2 F (37.9 C)  Recent Labs  Lab 12/06/19 2235  WBC 3.1*  CREATININE 16.90*  LATICACIDVEN 1.3    Estimated Creatinine Clearance: 6.5 mL/min (A) (by C-G formula based on SCr of 16.9 mg/dL (H)).    Zosyn 12/29>> Vanco 12/29>>  12/28 Bcx: ngtd COVID (+)  Thanks for allowing pharmacy to be a part of this patient's care.  Margot Ables, PharmD Clinical Pharmacist 12/07/2019 12:31 PM

## 2019-12-07 NOTE — ED Provider Notes (Signed)
Care assumed from Dr. Sabra Heck, patient with possible sepsis pending COVID-19 antigen.  COVID-19 antigen is negative, PCR pending.  He has been hemodynamically stable although with tenuous blood pressure since arrival in the ED.  Case is discussed with Dr. Olevia Bowens of [facility], who agrees to admit the patient.   Delora Fuel, MD [id number] [phone]

## 2019-12-07 NOTE — H&P (Signed)
History and Physical    Jesus Williams [id number] DOB: 1961-11-04 DOA: 12/06/2019  PCP: Patient, No Pcp Per   Patient coming from: Home.  I have personally briefly reviewed patient's old medical records in [place]  Chief Complaint: Shortness of breath.  HPI: Jesus Williams is a 58 y.o. male with medical history significant of morbid obesity, OSA not on CPAP, CKD, gout, hyperlipidemia, hypertension ESRD on HD who is coming to the emergency department due to progressively worse dyspnea for 1 week associated with occasionally productive of yellowish sputum cough and fever.   He was hypotensive and tachycardic on arrival.  He was exposed to a family member that had a URI. He denies rhinorrhea but has mild sore throat.  He has decreased oral intake and has had some difficulty sleeping.  He denies headache, chest pain, dizziness, abdominal pain, nausea, vomiting, diarrhea, constipation, melena or hematochezia.  He occasionally urinates and denies dysuria or frequency.  ED Course: Initial vital signs temperature 100.2 F, pulse 103, respirations 20, blood pressure 89/63 mmHg and O2 sat 88% on room air.  His O2 sat improved to 93% on nasal cannula.  He received Zosyn and vancomycin in the ED.  White count is 3.1, hemoglobin 11.4 g/dL and platelets 241.  D-dimer was 0.7.  Fibrinogen 513.  Triglycerides 193, LDH 244.  Lactic acid was normal at 1.3 mmol/L.  His procalcitonin was 11.61.CRP was 10.1, ferritin 1635.  Sodium 128, potassium 5.0, chloride 87 and CO2 21 mmol/L.  Glucose 135, calcium 7.9, BUN 98 and creatinine 16.9 mg/dL.  Total protein 7.8 and albumin 3.5 g/dL.  The rest of the hepatic functions are within normal limits.  Blood cultures were drawn.  Chest radiograph shows cardiomegaly with mild central congestion, but no definite focal airspace disease.  Review of Systems: As per HPI otherwise 10 point review of systems negative.   Past Medical History:  Diagnosis Date  .  Chronic kidney disease    chronic  . Gout   . Hyperlipidemia    mixed. Fair control with current meds.  . Hypertension   . OSA (obstructive sleep apnea)   . Severe obesity (Jesus Williams)    Pt followed by a [facility].    Past Surgical History:  Procedure Laterality Date  . AV FISTULA INSERTION W/ RF MAGNETIC GUIDANCE       reports that he has never smoked. He has never used smokeless tobacco. He reports previous alcohol use. He reports previous drug use.  Not on File  Family History  Problem Relation Age of Onset  . Hypertension Mother    Prior to Admission medications   Medication Sig Start Date End Date Taking? Authorizing Provider  allopurinol (ZYLOPRIM) 100 MG tablet Take 100 mg by mouth daily.    [provider]  calcium carbonate (TUMS EX) 750 MG chewable tablet Chew 1 tablet by mouth daily.    [provider]  carvedilol (COREG) 25 MG tablet Take 25 mg by mouth 2 (two) times daily with a meal.    [provider]  cloNIDine (CATAPRES) 0.2 MG tablet Take 0.2 mg by mouth 2 (two) times daily.    [provider]  glipiZIDE (GLUCOTROL) 5 MG tablet Take by mouth daily before breakfast.    [provider]  hydrALAZINE (APRESOLINE) 50 MG tablet Take 50 mg by mouth 3 (three) times daily.    [provider]  insulin aspart (NOVOLOG) 100 UNIT/ML injection Inject into the skin 3 (three)  times daily before meals.    [provider]  Ipratropium-Albuterol (COMBIVENT) 20-100 MCG/ACT AERS respimat Inhale 1 puff into the lungs every 6 (six) hours.    [provider]  lidocaine-prilocaine (EMLA) cream Apply 1 application topically as needed. Apply 30 minutes prior to dialysis.    [provider]  nitroGLYCERIN (NITROLINGUAL) 0.4 MG/SPRAY spray Place 1 spray under the tongue every 5 (five) minutes x 3 doses as needed for chest pain.    [provider]  OXYGEN Inhale 2-4 L into the lungs daily as needed.     [provider]  sevelamer carbonate (RENVELA) 800 MG tablet Take 800 mg by mouth 3 (three) times daily with meals.    [provider]  terazosin (HYTRIN) 10 MG capsule Take 10 mg by mouth at bedtime.    [provider]  Vitamin D, Ergocalciferol, (DRISDOL) 50000 units CAPS capsule Take 50,000 Units by mouth every 7 (seven) days.    [provider]    Physical Exam: Vitals:   12/06/19 2058 12/06/19 2100 12/06/19 2103  BP: (!) 89/63    Pulse: (!) 103    Resp: 20    Temp: 100.2 F (37.9 C)    TempSrc: Oral    SpO2: (!) 88%  93%  Weight:  134.7 kg   Height:  5\' 7"  (1.702 m)     Constitutional: Febrile, but in NAD, calm, comfortable Eyes: PERRL, lids and conjunctivae normal ENMT: Mucous membranes are moist. Posterior pharynx clear of any exudate or lesions. Neck: normal, supple, no masses, no thyromegaly Respiratory: No wheezing, mild rhonchi and bibasilar crackles. Normal respiratory effort. No accessory muscle use.  Cardiovascular: Regular rate and rhythm, no murmurs / rubs / gallops. No extremity edema. 2+ pedal pulses. No carotid bruits.  Abdomen: Nondistended, obese.  Soft, no tenderness, no masses palpated. No hepatosplenomegaly. Bowel sounds positive.  Musculoskeletal: no clubbing / cyanosis. Good ROM, no contractures. Normal muscle tone.  Skin: no rashes, lesions, ulcers on limited dermatological examination. Neurologic: CN 2-12 grossly intact. Sensation intact, DTR normal. Strength 5/5 in all 4.  Psychiatric: Normal judgment and insight. Alert and oriented x 3. Normal mood.   Labs on Admission: I have personally reviewed following labs and imaging studies  CBC: Recent Labs  Lab 12/06/19 2235  WBC 3.1*  NEUTROABS 2.2  HGB 11.4*  HCT 34.5*  MCV 96.9  PLT [id number]   Basic Metabolic Panel: Recent Labs  Lab 12/06/19 2235  NA 128*  K 5.0  CL 87*  CO2 21*  GLUCOSE 135*  BUN 98*  CREATININE 16.90*  CALCIUM 7.9*   GFR: Estimated  Creatinine Clearance: 6.5 mL/min (A) (by C-G formula based on SCr of 16.9 mg/dL (H)). Liver Function Tests: Recent Labs  Lab 12/06/19 2235  AST 34  ALT 21  ALKPHOS 56  BILITOT 0.7  PROT 7.8  ALBUMIN 3.5   No results for input(s): LIPASE, AMYLASE in the last 168 hours. No results for input(s): AMMONIA in the last 168 hours. Coagulation Profile: No results for input(s): INR, PROTIME in the last 168 hours. Cardiac Enzymes: No results for input(s): CKTOTAL, CKMB, CKMBINDEX, TROPONINI in the last 168 hours. BNP (last 3 results) No results for input(s): PROBNP in the last 8760 hours. HbA1C: No results for input(s): HGBA1C in the last 72 hours. CBG: No results for input(s): GLUCAP in the last 168 hours. Lipid Profile: Recent Labs    12/06/19 2235  TRIG 193*   Thyroid Function Tests: No results  for input(s): TSH, T4TOTAL, FREET4, T3FREE, THYROIDAB in the last 72 hours. Anemia Panel: Recent Labs    12/06/19 2235  FERRITIN 1,635*   Urine analysis: No results found for: COLORURINE, APPEARANCEUR, LABSPEC, PHURINE, GLUCOSEU, HGBUR, BILIRUBINUR, KETONESUR, PROTEINUR, UROBILINOGEN, NITRITE, LEUKOCYTESUR  Radiological Exams on Admission: DG Chest Port 1 View  Result Date: 12/06/2019 CLINICAL DATA:  Shortness of breath EXAM: PORTABLE CHEST 1 VIEW COMPARISON:  04/23/2013 FINDINGS: Cardiomegaly with mild central congestion. No pleural effusion or overt pulmonary edema. No pneumothorax. IMPRESSION: Cardiomegaly with mild central congestion. No definite focal airspace disease. Electronically Signed   By: Donavan Foil M.D.   On: 12/06/2019 22:39    EKG: Independently reviewed.  Vent. rate 102 BPM PR interval 128 ms QRS duration 150 ms QT/QTc 394/513 ms P-R-T axes 69 166 -9 Sinus tachycardia Right bundle branch block T wave abnormality, consider inferior ischemia Abnormal ECG No old EKGs available.  Assessment/Plan Principal Problem:   SIRS (systemic inflammatory response  syndrome) (HCC) Unknown source, but suspect respiratory tract. Vital signs are stable. Observation/telemetry. Continue IV antibiotics for now. Will defer IVF to nephrology. Follow blood culture and sensitivity.  Active Problems:   Type 2 diabetes mellitus (HCC) Carbohydrate modified diet. CBG monitoring with RI SS. Check hemoglobin A1c    Gout Continue allopurinol after med rec performed.    Hypertension Continue antihypertensives after med rec performed. Monitor blood pressure.    Hyponatremia Replacement deferred due to ESRD. Monitor sodium level.    Anemia Monitor H&H. Erythropoietin per nephrology.    Leukopenia Monitor WBC level    Hyperlipidemia Not on medical treatment.   DVT prophylaxis: Lovenox SQ Code Status: Full code Family Communication:  Disposition Plan: Observation for SIRS treatment. Consults called: Admission status: Observation/telemetry.   Reubin Milan MD [facility]  If 7PM-7AM, please contact night-coverage [email or web address]  12/07/2019, 5:02 AM   This document was prepared using Dragon voice recognition software and may contain some unintended transcription errors.

## 2019-12-07 NOTE — ED Notes (Signed)
CRITICAL VALUE ALERT  Critical Value:  Covid +   Date & Time Notied:  12/07/19  [id number]  Provider Notified: Dr Melina Copa  Orders Received/Actions taken:

## 2019-12-07 NOTE — Consult Note (Addendum)
[facility] Renal Consultation Note  Requesting MD: Irwin Brakeman, MD  Indication for Consultation:  ESRD  Chief complaint: shortness of breath  HPI:  Jesus Williams is a 58 y.o. male with a history including ESRD MWF at [facility], DM, and HTN who presented to the ER with progressive shortness of breath.  Patient was found to be covid 19 positive.  Bed was not available at [facility] and he is to be admitted to [facility] per the hospitalist service.  Spoke with HD unit RN and they have agreed to dialyze the patient as long as he meets [facility]'s admission triage requirements of less than 4 liters oxygen.  Called his outpatient HD unit.  Outpatient orders for HD are below.  He missed treatment on 12/28.  Last treatment was 12/26 and his departure weight was 131.8 kg.  Examined in person with appropriate PPE.  He states that he doesn't take hydralazine at home and doesn't know dose of clonidine but takes it once a day.   PMHx:   Past Medical History:  Diagnosis Date  . Chronic kidney disease    chronic  . Gout   . Hyperlipidemia    mixed. Fair control with current meds.  . Hypertension   . OSA (obstructive sleep apnea)   . Severe obesity ([place])    Pt followed by a [facility].    Past Surgical History:  Procedure Laterality Date  . AV FISTULA INSERTION W/ RF MAGNETIC GUIDANCE      Family Hx:  Family History  Problem Relation Age of Onset  . Hypertension Mother   father had ESRD on HD   Social History:  reports that he has never smoked. He has never used smokeless tobacco. He reports previous alcohol use. He reports previous drug use.  Allergies:  No known drug allergies per mother   Medications: Prior to Admission medications   Medication Sig Start Date End Date Taking? Authorizing Provider  allopurinol (ZYLOPRIM) 100 MG tablet Take 100 mg by mouth daily.    [provider]  calcium carbonate (TUMS EX) 750 MG chewable tablet Chew 1 tablet  by mouth daily.    [provider]  carvedilol (COREG) 25 MG tablet Take 25 mg by mouth 2 (two) times daily with a meal.    [provider]  cloNIDine (CATAPRES) 0.2 MG tablet Take 0.2 mg by mouth 2 (two) times daily.    [provider]  glipiZIDE (GLUCOTROL) 5 MG tablet Take by mouth daily before breakfast.    [provider]  hydrALAZINE (APRESOLINE) 50 MG tablet Take 50 mg by mouth 3 (three) times daily. (pt states not taking)   [provider]  insulin aspart (NOVOLOG) 100 UNIT/ML injection Inject into the skin 3 (three) times daily before meals.    [provider]  Ipratropium-Albuterol (COMBIVENT) 20-100 MCG/ACT AERS respimat Inhale 1 puff into the lungs every 6 (six) hours.    [provider]  lidocaine-prilocaine (EMLA) cream Apply 1 application topically as needed. Apply 30 minutes prior to dialysis.    [provider]  nitroGLYCERIN (NITROLINGUAL) 0.4 MG/SPRAY spray Place 1 spray under the tongue every 5 (five) minutes x 3 doses as needed for chest pain.    [provider]  OXYGEN Inhale 2-4 L into the lungs daily as needed.    [provider]  sevelamer carbonate (RENVELA) 800 MG tablet Take 800 mg by mouth 3 (three) times daily with meals.  [provider]  terazosin (HYTRIN) 10 MG capsule Take 10 mg by mouth at bedtime.    [provider]  Vitamin D, Ergocalciferol, (DRISDOL) 50000 units CAPS capsule Take 50,000 Units by mouth every 7 (seven) days.    [provider]    I have reviewed the patient's current medications as reported.  Labs:  BMP Latest Ref Rng & Units 12/07/2019 12/06/2019  Glucose 70 - 99 mg/dL 136(H) 135(H)  BUN 6 - 20 mg/dL 102(H) 98(H)  Creatinine 0.61 - 1.24 mg/dL 17.38(H) 16.90(H)  Sodium 135 - 145 mmol/L 130(L) 128(L)  Potassium 3.5 - 5.1 mmol/L 4.9 5.0  Chloride 98 - 111 mmol/L 88(L) 87(L)  CO2 22 - 32 mmol/L 18(L) 21(L)  Calcium 8.9 -  10.3 mg/dL 7.8(L) 7.9(L)     ROS:  Pertinent items noted in HPI and remainder of comprehensive ROS otherwise negative.   Physical Exam: Vitals:   12/07/19 0913 12/07/19 0930  BP: 101/78 124/81  Pulse: 87 93  Resp: 17 14  Temp:    SpO2: 100% 98%     Blood pressure 123/85 on my exam  General: adult male in bed seated NAD on 3 liters oxygen at 99% HEENT: NCAT  Eyes: EOMI sclera anicteric Neck:  Supple trachea midline Heart:  Tachycardic no rub Lungs:  Clear but diminished unlabored at rest on 3 liters oxygen and 99% Abdomen: soft/obese/NT  Extremities: no pitting edema appreciate; no cyanosis or clubbing Skin: no rash on extremities exposed  Neuro:  Alert and oriented x 3; provides hx and follows commands Access LUE AVF with thrill   Outpatient dialysis orders 4 hours and 1minutes  MWF BF 500 DF 600 per report  2K/2.5 calcium bath  Na 138 (standard) EDW is 132 kg  Heparin 3000 units load and hourly dose of 1200 units stop 1 hour before end of dialysis epogen 1200 units IVP three times weekly  hectorol 3.5 mcg three times weekly  venofer 50 mg once weekly   Assessment/Plan:  # ESRD  - HD normally is per MWF schedule  - Missed HD on 12/28  - Plan for HD today, 12/29 then on 12/30 to resume MWF schedule  - Blood pressure 123/85 on exam.  If hemodynamics worsen i.e. hypotension then would need to transfer to [facility]  # Covid 19 infection  - Therapies per primary team   - supportive care per primary team   # Acute hypoxic respiratory failure  - secondary to covid 19 infection - supplemental oxygen per primary team   # HTN  - Agree with holding anti-hypertensives for now given his hypotension - Note he is not actually taking hydralazine at home and only takes clonidine once a day - doesn't know dose to confirm  # Anemia  - no acute indication for ESA - follow trends  # Secondary hyperpara  - continue hectorol and binders - needs renal diet    Claudia Desanctis 12/07/2019, 11:28 AM

## 2019-12-07 NOTE — Progress Notes (Addendum)
[facility] CARE NOTE   12/07/2019 10:33 AM  Jesus Williams was seen and examined.  The H&P by the admitting provider, orders, imaging was reviewed.  Please see new orders.  Since admission he has tested positive for Covid 19.  I have started the Covid 19 orderset bundle.  Starting IV decadron.  I have asked the pharmD if he could have remdesivir given ESRD status. Consult placed for nephrology for HD treatments.  Unable to transfer to [facility] due to need for HD.  Pt says that he dialyzes MWF but missed HD yesterday.   [facility] does not have any beds per [facility]. Will have to hold in the ED until another bed opens up.   Vitals:   12/07/19 0913 12/07/19 0930  BP: 101/78 124/81  Pulse: 87 93  Resp: 17 14  Temp:    SpO2: 100% 98%    Results for orders placed or performed during the hospital encounter of 12/06/19  Blood Culture (routine x 2)   Specimen: Right Antecubital; Blood  Result Value Ref Range   Specimen Description RIGHT ANTECUBITAL    Special Requests      BOTTLES DRAWN AEROBIC AND ANAEROBIC Blood Culture adequate volume   Culture      NO GROWTH < 12 HOURS Performed at [facility], [address] [place], [place] [address]    Report Status PENDING   Blood Culture (routine x 2)   Specimen: BLOOD RIGHT HAND  Result Value Ref Range   Specimen Description BLOOD RIGHT HAND    Special Requests      BOTTLES DRAWN AEROBIC AND ANAEROBIC Blood Culture adequate volume   Culture      NO GROWTH < 12 HOURS Performed at [facility], [address] [place],  [address]    Report Status PENDING   Respiratory Panel by RT PCR (Flu A&B, Covid) - Nasopharyngeal Swab   Specimen: Nasopharyngeal Swab  Result Value Ref Range   SARS Coronavirus 2 by RT PCR POSITIVE (A) NEGATIVE   Influenza A by PCR NEGATIVE NEGATIVE   Influenza B by PCR NEGATIVE NEGATIVE  Lactic acid, plasma  Result Value Ref Range   Lactic Acid, Venous 1.3 0.5 - 1.9 mmol/L  CBC WITH DIFFERENTIAL  Result Value Ref  Range   WBC 3.1 (L) 4.0 - 10.5 K/uL   RBC 3.56 (L) 4.22 - 5.81 MIL/uL   Hemoglobin 11.4 (L) 13.0 - 17.0 g/dL   HCT 34.5 (L) 39.0 - 52.0 %   MCV 96.9 80.0 - 100.0 fL   MCH 32.0 26.0 - 34.0 pg   MCHC 33.0 30.0 - 36.0 g/dL   RDW 15.3 11.5 - 15.5 %   Platelets 241 150 - 400 K/uL   nRBC 0.0 0.0 - 0.2 %   Neutrophils Relative % 71 %   Neutro Abs 2.2 1.7 - 7.7 K/uL   Lymphocytes Relative 12 %   Lymphs Abs 0.4 (L) 0.7 - 4.0 K/uL   Monocytes Relative 16 %   Monocytes Absolute 0.5 0.1 - 1.0 K/uL   Eosinophils Relative 0 %   Eosinophils Absolute 0.0 0.0 - 0.5 K/uL   Basophils Relative 0 %   Basophils Absolute 0.0 0.0 - 0.1 K/uL   Immature Granulocytes 1 %   Abs Immature Granulocytes 0.02 0.00 - 0.07 K/uL  Comprehensive metabolic panel  Result Value Ref Range   Sodium 128 (L) 135 - 145 mmol/L   Potassium 5.0 3.5 - 5.1 mmol/L   Chloride 87 (L) 98 -  111 mmol/L   CO2 21 (L) 22 - 32 mmol/L   Glucose, Bld 135 (H) 70 - 99 mg/dL   BUN 98 (H) 6 - 20 mg/dL   Creatinine, Ser 16.90 (H) 0.61 - 1.24 mg/dL   Calcium 7.9 (L) 8.9 - 10.3 mg/dL   Total Protein 7.8 6.5 - 8.1 g/dL   Albumin 3.5 3.5 - 5.0 g/dL   AST 34 15 - 41 U/L   ALT 21 0 - 44 U/L   Alkaline Phosphatase 56 38 - 126 U/L   Total Bilirubin 0.7 0.3 - 1.2 mg/dL   GFR calc non Af Amer 3 (L) >60 mL/min   GFR calc Af Amer 3 (L) >60 mL/min   Anion gap 20 (H) 5 - 15  D-dimer, quantitative  Result Value Ref Range   D-Dimer, Quant 0.70 (H) 0.00 - 0.50 ug/mL-FEU  Procalcitonin  Result Value Ref Range   Procalcitonin 11.61 ng/mL  Lactate dehydrogenase  Result Value Ref Range   LDH 244 (H) 98 - 192 U/L  Ferritin  Result Value Ref Range   Ferritin 1,635 (H) 24 - 336 ng/mL  Triglycerides  Result Value Ref Range   Triglycerides 193 (H) <150 mg/dL  Fibrinogen  Result Value Ref Range   Fibrinogen 513 (H) 210 - 475 mg/dL  C-reactive protein  Result Value Ref Range   CRP 10.1 (H) <1.0 mg/dL  CBC  Result Value Ref Range   WBC 2.8 (L) 4.0  - 10.5 K/uL   RBC 3.57 (L) 4.22 - 5.81 MIL/uL   Hemoglobin 11.6 (L) 13.0 - 17.0 g/dL   HCT 34.9 (L) 39.0 - 52.0 %   MCV 97.8 80.0 - 100.0 fL   MCH 32.5 26.0 - 34.0 pg   MCHC 33.2 30.0 - 36.0 g/dL   RDW 15.3 11.5 - 15.5 %   Platelets 224 150 - 400 K/uL   nRBC 0.0 0.0 - 0.2 %  Basic metabolic panel  Result Value Ref Range   Sodium 130 (L) 135 - 145 mmol/L   Potassium 4.9 3.5 - 5.1 mmol/L   Chloride 88 (L) 98 - 111 mmol/L   CO2 18 (L) 22 - 32 mmol/L   Glucose, Bld 136 (H) 70 - 99 mg/dL   BUN 102 (H) 6 - 20 mg/dL   Creatinine, Ser 17.38 (H) 0.61 - 1.24 mg/dL   Calcium 7.8 (L) 8.9 - 10.3 mg/dL   GFR calc non Af Amer 3 (L) >60 mL/min   GFR calc Af Amer 3 (L) >60 mL/min  Hemoglobin A1c  Result Value Ref Range   Hgb A1c MFr Bld 7.0 (H) 4.8 - 5.6 %   Mean Plasma Glucose 154.2 mg/dL  POC SARS Coronavirus 2 Ag-ED - Nasal Swab (BD Veritor Kit)  Result Value Ref Range   SARS Coronavirus 2 Ag NEGATIVE NEGATIVE  CBG monitoring, ED  Result Value Ref Range   Glucose-Capillary 105 (H) 70 - 99 mg/dL   Time spent 34 minutes   Murvin Natal, MD [facility]   12/06/2019  9:48 PM How to contact the [facility] Attending or Consulting provider [place] or covering provider during after hours 7P -7A, for this patient?  1. Check the care team in [facility] and look for a) attending/consulting [facility] provider listed and b) the [facility] team listed 2. Log into [email or web address] and use 's universal password to access. If you do not have the password, please contact the hospital operator. 3. Locate the [facility] provider you are looking for  under [facility] and page to a number that you can be directly reached. 4. If you still have difficulty reaching the provider, please page the [facility] (Director on Call) for the Hospitalists listed on amion for assistance.

## 2019-12-07 NOTE — ED Notes (Signed)
ED TO INPATIENT HANDOFF REPORT  ED Nurse Name and Phone #: [phone]  S Name/Age/Gender Jesus Williams 58 y.o. male Room/Bed: APA15/APA15  Code Status   Code Status: Full Code  Home/SNF/Other Home Patient oriented to: self, place, time and situation Is this baseline? Yes   Triage Complete: Triage complete  Chief Complaint SIRS (systemic inflammatory response syndrome) (HCC) [R65.10] Acute respiratory failure with hypoxia (HCC) [J96.01]  Triage Note Pt c/o sob x one week. Pt's last dialysis was Saturday.     Allergies Not on File  Level of Care/Admitting Diagnosis ED Disposition    ED Disposition Condition [facility] Area: [facility] [[id number]]  Level of Care: Med-Surg [[id number]]  Covid Evaluation: Confirmed COVID Positive  Diagnosis: Acute respiratory failure with hypoxia [facility] [[id number]]  Admitting Physician: [place], [place]  Attending Physician: Murlean Iba [[id number]]  Estimated length of stay: past midnight tomorrow  Certification:: I certify this patient will need inpatient services for at least 2 midnights       B Medical/Surgery History Past Medical History:  Diagnosis Date  . Chronic kidney disease    chronic  . Gout   . Hyperlipidemia    mixed. Fair control with current meds.  . Hypertension   . OSA (obstructive sleep apnea)   . Severe obesity ([place])    Pt followed by a [facility].   Past Surgical History:  Procedure Laterality Date  . AV FISTULA INSERTION W/ RF MAGNETIC GUIDANCE       A IV Location/Drains/Wounds Patient Lines/Drains/Airways Status   Active Line/Drains/Airways    Name:   Placement date:   Placement time:   Site:   Days:   Peripheral IV 12/07/19 Right Antecubital   12/07/19    0225    Antecubital   less than 1          Intake/Output Last 24 hours  Intake/Output Summary (Last 24 hours) at 12/07/2019 2032 Last data filed at 12/07/2019 1830 Gross per 24 hour  Intake 519.16 ml   Output -  Net 519.16 ml    Labs/Imaging Results for orders placed or performed during the hospital encounter of 12/06/19 (from the past 48 hour(s))  SARS CORONAVIRUS 2 (TAT 6-24 HRS) Nasopharyngeal Nasopharyngeal Swab     Status: Abnormal   Collection Time: 12/06/19 10:35 PM   Specimen: Nasopharyngeal Swab  Result Value Ref Range   SARS Coronavirus 2 POSITIVE (A) NEGATIVE    Comment: RESULT CALLED TO, READ BACK BY AND VERIFIED WITH: Jani Gravel RN 13:30 12/07/19 ([email or web address]) (NOTE) SARS-CoV-2 target nucleic acids are DETECTED. The SARS-CoV-2 RNA is generally detectable in upper and lower respiratory specimens during the acute phase of infection. Positive results are indicative of the presence of SARS-CoV-2 RNA. Clinical correlation with patient history and other diagnostic information is  necessary to determine patient infection status. Positive results do not rule out bacterial infection or co-infection with other viruses.  The expected result is Negative. Fact Sheet for Patients: SugarRoll.be Fact Sheet for Healthcare Providers: https://www.woods-mathews.com/ This test is not yet approved or cleared by the [place] FDA and  has been authorized for detection and/or diagnosis of SARS-CoV-2 by FDA under an Emergency Use Authorization (EUA). This EUA will remain  in effect (meaning this test can be used) for t he duration of the COVID-19 declaration under Section 564(b)(1) of the Act, 21 U.S.C. section 360bbb-3(b)(1), unless the authorization is terminated or revoked sooner. Performed at [facility], [address] [place], [place] [address]   Lactic acid, plasma     Status: None   Collection Time: 12/06/19 10:35 PM  Result Value Ref Range   Lactic Acid, Venous 1.3 0.5 - 1.9 mmol/L    Comment: Performed at [facility], [address], [place], [place] [address]  Blood Culture (routine x 2)     Status: None (Preliminary result)    Collection Time: 12/06/19 10:35 PM   Specimen: Right Antecubital; Blood  Result Value Ref Range   Specimen Description RIGHT ANTECUBITAL    Special Requests      BOTTLES DRAWN AEROBIC AND ANAEROBIC Blood Culture adequate volume   Culture      NO GROWTH < 12 HOURS Performed at [facility], [address] [place], [place] [address]    Report Status PENDING   CBC WITH DIFFERENTIAL     Status: Abnormal   Collection Time: 12/06/19 10:35 PM  Result Value Ref Range   WBC 3.1 (L) 4.0 - 10.5 K/uL   RBC 3.56 (L) 4.22 - 5.81 MIL/uL   Hemoglobin 11.4 (L) 13.0 - 17.0 g/dL   HCT 34.5 (L) 39.0 - 52.0 %   MCV 96.9 80.0 - 100.0 fL   MCH 32.0 26.0 - 34.0 pg   MCHC 33.0 30.0 - 36.0 g/dL   RDW 15.3 11.5 - 15.5 %   Platelets 241 150 - 400 K/uL   nRBC 0.0 0.0 - 0.2 %   Neutrophils Relative % 71 %   Neutro Abs 2.2 1.7 - 7.7 K/uL   Lymphocytes Relative 12 %   Lymphs Abs 0.4 (L) 0.7 - 4.0 K/uL   Monocytes Relative 16 %   Monocytes Absolute 0.5 0.1 - 1.0 K/uL   Eosinophils Relative 0 %   Eosinophils Absolute 0.0 0.0 - 0.5 K/uL   Basophils Relative 0 %   Basophils Absolute 0.0 0.0 - 0.1 K/uL   Immature Granulocytes 1 %   Abs Immature Granulocytes 0.02 0.00 - 0.07 K/uL    Comment: Performed at [facility], [address], [place], [place] [address]  Comprehensive metabolic panel     Status: Abnormal   Collection Time: 12/06/19 10:35 PM  Result Value Ref Range   Sodium 128 (L) 135 - 145 mmol/L   Potassium 5.0 3.5 - 5.1 mmol/L   Chloride 87 (L) 98 - 111 mmol/L   CO2 21 (L) 22 - 32 mmol/L   Glucose, Bld 135 (H) 70 - 99 mg/dL   BUN 98 (H) 6 - 20 mg/dL   Creatinine, Ser 16.90 (H) 0.61 - 1.24 mg/dL   Calcium 7.9 (L) 8.9 - 10.3 mg/dL   Total Protein 7.8 6.5 - 8.1 g/dL   Albumin 3.5 3.5 - 5.0 g/dL   AST 34 15 - 41 U/L   ALT 21 0 - 44 U/L   Alkaline Phosphatase 56 38 - 126 U/L   Total Bilirubin 0.7 0.3 - 1.2 mg/dL   GFR calc non Af Amer 3 (L) >60 mL/min   GFR calc Af Amer 3 (L) >60 mL/min    Anion gap 20 (H) 5 - 15    Comment: Performed at [facility], [address], [place], [place] [address]  D-dimer, quantitative     Status: Abnormal   Collection Time: 12/06/19 10:35 PM  Result Value Ref Range   D-Dimer, Quant 0.70 (H) 0.00 - 0.50 ug/mL-FEU    Comment: (NOTE) At the manufacturer cut-off of 0.50 ug/mL FEU, this assay has been documented to exclude  PE with a sensitivity and negative predictive value of 97 to 99%.  At this time, this assay has not been approved by the FDA to exclude DVT/VTE. Results should be correlated with clinical presentation. Performed at [facility], [address] [place], [place] [address]   Procalcitonin     Status: None   Collection Time: 12/06/19 10:35 PM  Result Value Ref Range   Procalcitonin 11.61 ng/mL    Comment:        Interpretation: PCT >= 10 ng/mL: Important systemic inflammatory response, almost exclusively due to severe bacterial sepsis or septic shock. (NOTE)       Sepsis PCT Algorithm           Lower Respiratory Tract                                      Infection PCT Algorithm    ----------------------------     ----------------------------         PCT < 0.25 ng/mL                PCT < 0.10 ng/mL         Strongly encourage             Strongly discourage   discontinuation of antibiotics    initiation of antibiotics    ----------------------------     -----------------------------       PCT 0.25 - 0.50 ng/mL            PCT 0.10 - 0.25 ng/mL               OR       >80% decrease in PCT            Discourage initiation of                                            antibiotics      Encourage discontinuation           of antibiotics    ----------------------------     -----------------------------         PCT >= 0.50 ng/mL              PCT 0.26 - 0.50 ng/mL                AND       <80% decrease in PCT             Encourage initiation of                                             antibiotics       Encourage continuation            of antibiotics    ----------------------------     -----------------------------        PCT >= 0.50 ng/mL                  PCT > 0.50 ng/mL               AND         increase in PCT  Strongly encourage                                      initiation of antibiotics    Strongly encourage escalation           of antibiotics                                     -----------------------------                                           PCT <= 0.25 ng/mL                                                 OR                                        > 80% decrease in PCT                                     Discontinue / Do not initiate                                             antibiotics Performed at [facility], [address] [place], [place] [address]   Lactate dehydrogenase     Status: Abnormal   Collection Time: 12/06/19 10:35 PM  Result Value Ref Range   LDH 244 (H) 98 - 192 U/L    Comment: Performed at [facility], [address] [place], [place] [address]  Ferritin     Status: Abnormal   Collection Time: 12/06/19 10:35 PM  Result Value Ref Range   Ferritin 1,635 (H) 24 - 336 ng/mL    Comment: Performed at [facility], [address] [place], [place] [address]  Triglycerides     Status: Abnormal   Collection Time: 12/06/19 10:35 PM  Result Value Ref Range   Triglycerides 193 (H) <150 mg/dL    Comment: Performed at [facility], [address] [place], [place] [address]  Fibrinogen     Status: Abnormal   Collection Time: 12/06/19 10:35 PM  Result Value Ref Range   Fibrinogen 513 (H) 210 - 475 mg/dL    Comment: Performed at [facility], [address], [place], [place] [address]  C-reactive protein     Status: Abnormal   Collection Time: 12/06/19 10:35 PM  Result Value Ref Range   CRP 10.1 (H) <1.0 mg/dL    Comment: Performed at [facility], [address] [place], [place] [address]  Blood Culture (routine x 2)     Status: None (Preliminary result)    Collection Time: 12/06/19 11:11 PM   Specimen: BLOOD RIGHT HAND  Result Value Ref Range   Specimen Description BLOOD RIGHT HAND    Special Requests  BOTTLES DRAWN AEROBIC AND ANAEROBIC Blood Culture adequate volume   Culture      NO GROWTH < 12 HOURS Performed at [facility], [address] [place], [place] [address]    Report Status PENDING   POC SARS Coronavirus 2 Ag-ED - Nasal Swab (BD Veritor Kit)     Status: None   Collection Time: 12/07/19 12:32 AM  Result Value Ref Range   SARS Coronavirus 2 Ag NEGATIVE NEGATIVE    Comment: (NOTE) SARS-CoV-2 antigen NOT DETECTED.  Negative results are presumptive.  Negative results do not preclude SARS-CoV-2 infection and should not be used as the sole basis for treatment or other patient management decisions, including infection  control decisions, particularly in the presence of clinical signs and  symptoms consistent with COVID-19, or in those who have been in contact with the virus.  Negative results must be combined with clinical observations, patient history, and epidemiological information. The expected result is Negative. Fact Sheet for Patients: PodPark.tn Fact Sheet for Healthcare Providers: GiftContent.is This test is not yet approved or cleared by the [place] FDA and  has been authorized for detection and/or diagnosis of SARS-CoV-2 by FDA under an Emergency Use Authorization (EUA).  This EUA will remain in effect (meaning this test can be used) for the duration of  the COVID-19 de claration under Section 564(b)(1) of the Act, 21 U.S.C. section 360bbb-3(b)(1), unless the authorization is terminated or revoked sooner.   Respiratory Panel by RT PCR (Flu A&B, Covid) - Nasopharyngeal Swab     Status: Abnormal   Collection Time: 12/07/19  4:54 AM   Specimen: Nasopharyngeal Swab  Result Value Ref Range   SARS Coronavirus 2 by RT PCR POSITIVE (A) NEGATIVE     Comment: RESULT CALLED TO, READ BACK BY AND VERIFIED WITH: SHORE L. AT [id number] ON [id number] BY THOMPSON S.    Influenza A by PCR NEGATIVE NEGATIVE   Influenza B by PCR NEGATIVE NEGATIVE    Comment: (NOTE) The Xpert Xpress SARS-CoV-2/FLU/RSV assay is intended as an aid in  the diagnosis of influenza from Nasopharyngeal swab specimens and  should not be used as a sole basis for treatment. Nasal washings and  aspirates are unacceptable for Xpert Xpress SARS-CoV-2/FLU/RSV  testing. Fact Sheet for Patients: PinkCheek.be Fact Sheet for Healthcare Providers: GravelBags.it This test is not yet approved or cleared by the [place] FDA and  has been authorized for detection and/or diagnosis of SARS-CoV-2 by  FDA under an Emergency Use Authorization (EUA). This EUA will remain  in effect (meaning this test can be used) for the duration of the  Covid-19 declaration under Section 564(b)(1) of the Act, 21  U.S.C. section 360bbb-3(b)(1), unless the authorization is  terminated or revoked. Performed at [facility], [address] [place], [place] [address]   HIV Antibody (routine testing w rflx)     Status: None   Collection Time: 12/07/19  5:11 AM  Result Value Ref Range   HIV Screen 4th Generation wRfx NON REACTIVE NON REACTIVE    Comment: Performed at [place] [address] [place] [address]  CBC     Status: Abnormal   Collection Time: 12/07/19  5:11 AM  Result Value Ref Range   WBC 2.8 (L) 4.0 - 10.5 K/uL   RBC 3.57 (L) 4.22 - 5.81 MIL/uL   Hemoglobin 11.6 (L) 13.0 - 17.0 g/dL   HCT 34.9 (L) 39.0 - 52.0 %   MCV 97.8 80.0 - 100.0 fL  MCH 32.5 26.0 - 34.0 pg   MCHC 33.2 30.0 - 36.0 g/dL   RDW 15.3 11.5 - 15.5 %   Platelets 224 150 - 400 K/uL   nRBC 0.0 0.0 - 0.2 %    Comment: Performed at [facility], [address], [place], [place] [address]  Basic metabolic panel     Status: Abnormal   Collection Time:  12/07/19  5:11 AM  Result Value Ref Range   Sodium 130 (L) 135 - 145 mmol/L   Potassium 4.9 3.5 - 5.1 mmol/L   Chloride 88 (L) 98 - 111 mmol/L   CO2 18 (L) 22 - 32 mmol/L   Glucose, Bld 136 (H) 70 - 99 mg/dL   BUN 102 (H) 6 - 20 mg/dL    Comment: RESULTS CONFIRMED BY MANUAL DILUTION   Creatinine, Ser 17.38 (H) 0.61 - 1.24 mg/dL   Calcium 7.8 (L) 8.9 - 10.3 mg/dL   GFR calc non Af Amer 3 (L) >60 mL/min   GFR calc Af Amer 3 (L) >60 mL/min    Comment: Performed at [facility], [address], [place], [place] [address]  Hemoglobin A1c     Status: Abnormal   Collection Time: 12/07/19  5:11 AM  Result Value Ref Range   Hgb A1c MFr Bld 7.0 (H) 4.8 - 5.6 %    Comment: (NOTE) Pre diabetes:          5.7%-6.4% Diabetes:              >6.4% Glycemic control for   <7.0% adults with diabetes    Mean Plasma Glucose 154.2 mg/dL    Comment: Performed at [place] [address], [place],  [address]  CBG monitoring, ED     Status: Abnormal   Collection Time: 12/07/19  8:07 AM  Result Value Ref Range   Glucose-Capillary 105 (H) 70 - 99 mg/dL  ABO/Rh     Status: None   Collection Time: 12/07/19 11:39 AM  Result Value Ref Range   ABO/RH(D)      O POS Performed at [facility], [address] [place],  [address]   CBG monitoring, ED     Status: Abnormal   Collection Time: 12/07/19 12:09 PM  Result Value Ref Range   Glucose-Capillary 156 (H) 70 - 99 mg/dL  CBG monitoring, ED     Status: Abnormal   Collection Time: 12/07/19  4:13 PM  Result Value Ref Range   Glucose-Capillary 184 (H) 70 - 99 mg/dL   DG CHEST PORT 1 VIEW  Result Date: 12/07/2019 CLINICAL DATA:  COVID positive EXAM: PORTABLE CHEST 1 VIEW COMPARISON:  12/06/2019 FINDINGS: Cardiomegaly. There may be subtle heterogeneous airspace opacity in the bilateral lung bases. Osseous structures are unremarkable. IMPRESSION: 1. There may be subtle heterogeneous airspace opacity in the bilateral lung bases, generally  in keeping with COVID-19 airspace disease. This appears new or increased compared to prior radiographs. PA and lateral radiographs or CT may be helpful to further evaluate. 2.  Cardiomegaly. Electronically Signed   By: Eddie Candle M.D.   On: 12/07/2019 11:20   DG Chest Port 1 View  Result Date: 12/06/2019 CLINICAL DATA:  Shortness of breath EXAM: PORTABLE CHEST 1 VIEW COMPARISON:  04/23/2013 FINDINGS: Cardiomegaly with mild central congestion. No pleural effusion or overt pulmonary edema. No pneumothorax. IMPRESSION: Cardiomegaly with mild central congestion. No definite focal airspace disease. Electronically Signed   By: Donavan Foil M.D.   On: 12/06/2019 22:39  Pending Labs Unresulted Labs (From admission, onward)    Start     Ordered   12/14/19 0500  Creatinine, serum  (enoxaparin (LOVENOX)    CrCl >/= 30 ml/min)  Weekly,   R    Comments: while on enoxaparin therapy    12/07/19 0236   12/08/19 0500  CBC with Differential/Platelet  Daily,   R     12/07/19 1029   12/08/19 0500  Comprehensive metabolic panel  Daily,   R     12/07/19 1029   12/08/19 0500  C-reactive protein  Daily,   R     12/07/19 1029   12/08/19 0500  Ferritin  Daily,   R     12/07/19 1029   12/08/19 0500  Magnesium  Daily,   R     12/07/19 1029   12/08/19 0500  Phosphorus  Daily,   R     12/07/19 1029   12/08/19 0500  D-dimer, quantitative (not at [facility])  Daily,   R     12/07/19 1029          Vitals/Pain Today's Vitals   12/07/19 1830 12/07/19 1900 12/07/19 2000 12/07/19 2030  BP: 121/79 (!) 103/59 100/78 102/64  Pulse: 91 90 87 86  Resp:   17 13  Temp:      TempSrc:      SpO2: 99% 100% 99% (!) 89%  Weight:      Height:      PainSc:        Isolation Precautions Airborne and Contact precautions  Medications Medications  enoxaparin (LOVENOX) injection 30 mg (30 mg Subcutaneous Given 12/07/19 0923)  ondansetron (ZOFRAN) tablet 4 mg (has no administration in time range)    Or  ondansetron  (ZOFRAN) injection 4 mg (has no administration in time range)  acetaminophen (TYLENOL) tablet 650 mg (has no administration in time range)    Or  acetaminophen (TYLENOL) suppository 650 mg (has no administration in time range)  piperacillin-tazobactam (ZOSYN) IVPB 3.375 g (0 g Intravenous Stopped 12/07/19 1553)  albuterol (VENTOLIN HFA) 108 (90 Base) MCG/ACT inhaler 2 puff (2 puffs Inhalation Given 12/07/19 1931)  dexamethasone (DECADRON) injection 6 mg (6 mg Intravenous Given 12/07/19 1206)  guaiFENesin-dextromethorphan (ROBITUSSIN DM) 100-10 MG/5ML syrup 10 mL (has no administration in time range)  ascorbic acid (VITAMIN C) tablet 500 mg (500 mg Oral Given 12/07/19 1205)  zinc sulfate capsule 220 mg (220 mg Oral Given 12/07/19 1207)  Chlorhexidine Gluconate Cloth 2 % PADS 6 each (0 each Topical Hold 12/07/19 1224)  Chlorhexidine Gluconate Cloth 2 % PADS 6 each (has no administration in time range)  doxercalciferol (HECTOROL) injection 3.5 mcg (has no administration in time range)  vancomycin (VANCOCIN) IVPB 1000 mg/200 mL premix (has no administration in time range)  remdesivir 200 mg in sodium chloride 0.9% 250 mL IVPB (0 mg Intravenous Stopped 12/07/19 1702)    Followed by  remdesivir 100 mg in sodium chloride 0.9 % 100 mL IVPB (has no administration in time range)  vancomycin (VANCOCIN) IVPB 1000 mg/200 mL premix (0 mg Intravenous Stopped 12/07/19 0146)  piperacillin-tazobactam (ZOSYN) IVPB 3.375 g (0 g Intravenous Stopped 12/07/19 0028)  vancomycin (VANCOREADY) IVPB 750 mg/150 mL (0 mg Intravenous Stopped 12/07/19 0610)  vancomycin (VANCOCIN) IVPB 1000 mg/200 mL premix (0 mg Intravenous Stopped 12/07/19 1830)    Mobility walks High fall risk   Focused Assessments    R Recommendations: See Admitting Provider Note  Report given to:   Additional Notes:

## 2019-12-07 NOTE — ED Notes (Signed)
Pt given dinner tray.

## 2019-12-07 NOTE — ED Notes (Signed)
Gave breakfast tray  

## 2019-12-08 DIAGNOSIS — J9601 Acute respiratory failure with hypoxia: Secondary | ICD-10-CM

## 2019-12-08 DIAGNOSIS — E871 Hypo-osmolality and hyponatremia: Secondary | ICD-10-CM

## 2019-12-08 DIAGNOSIS — I10 Essential (primary) hypertension: Secondary | ICD-10-CM

## 2019-12-08 DIAGNOSIS — U071 COVID-19: Principal | ICD-10-CM

## 2019-12-08 LAB — COMPREHENSIVE METABOLIC PANEL
ALT: 20 U/L (ref 0–44)
AST: 26 U/L (ref 15–41)
Albumin: 3.2 g/dL — ABNORMAL LOW (ref 3.5–5.0)
Alkaline Phosphatase: 52 U/L (ref 38–126)
BUN: 120 mg/dL — ABNORMAL HIGH (ref 6–20)
CO2: 16 mmol/L — ABNORMAL LOW (ref 22–32)
Calcium: 7.7 mg/dL — ABNORMAL LOW (ref 8.9–10.3)
Chloride: 87 mmol/L — ABNORMAL LOW (ref 98–111)
Creatinine, Ser: 20.09 mg/dL — ABNORMAL HIGH (ref 0.61–1.24)
GFR calc Af Amer: 3 mL/min — ABNORMAL LOW (ref >60)
GFR calc non Af Amer: 2 mL/min — ABNORMAL LOW (ref >60)
Glucose, Bld: 143 mg/dL — ABNORMAL HIGH (ref 70–99)
Potassium: 5.5 mmol/L — ABNORMAL HIGH (ref 3.5–5.1)
Sodium: 130 mmol/L — ABNORMAL LOW (ref 135–145)
Total Bilirubin: 0.7 mg/dL (ref 0.3–1.2)
Total Protein: 7.4 g/dL (ref 6.5–8.1)

## 2019-12-08 LAB — CBC WITH DIFFERENTIAL/PLATELET
Abs Immature Granulocytes: 0.02 10*3/uL (ref 0.00–0.07)
Basophils Absolute: 0 10*3/uL (ref 0.0–0.1)
Basophils Relative: 0 %
Eosinophils Absolute: 0 10*3/uL (ref 0.0–0.5)
Eosinophils Relative: 0 %
HCT: 33.4 % — ABNORMAL LOW (ref 39.0–52.0)
Hemoglobin: 11.2 g/dL — ABNORMAL LOW (ref 13.0–17.0)
Immature Granulocytes: 1 %
Lymphocytes Relative: 10 %
Lymphs Abs: 0.3 10*3/uL — ABNORMAL LOW (ref 0.7–4.0)
MCH: 32.5 pg (ref 26.0–34.0)
MCHC: 33.5 g/dL (ref 30.0–36.0)
MCV: 96.8 fL (ref 80.0–100.0)
Monocytes Absolute: 0.4 10*3/uL (ref 0.1–1.0)
Monocytes Relative: 14 %
Neutro Abs: 2.2 10*3/uL (ref 1.7–7.7)
Neutrophils Relative %: 75 %
Platelets: 248 10*3/uL (ref 150–400)
RBC: 3.45 MIL/uL — ABNORMAL LOW (ref 4.22–5.81)
RDW: 15.4 % (ref 11.5–15.5)
WBC: 3 10*3/uL — ABNORMAL LOW (ref 4.0–10.5)
nRBC: 0 % (ref 0.0–0.2)

## 2019-12-08 LAB — GLUCOSE, CAPILLARY
Glucose-Capillary: 117 mg/dL — ABNORMAL HIGH (ref 70–99)
Glucose-Capillary: 232 mg/dL — ABNORMAL HIGH (ref 70–99)
Glucose-Capillary: 167 mg/dL — ABNORMAL HIGH (ref 70–99)
Glucose-Capillary: 324 mg/dL — ABNORMAL HIGH (ref 70–99)

## 2019-12-08 LAB — C-REACTIVE PROTEIN: CRP: 9.1 mg/dL — ABNORMAL HIGH (ref <1.0)

## 2019-12-08 LAB — VITAMIN D 25 HYDROXY (VIT D DEFICIENCY, FRACTURES): Vit D, 25-Hydroxy: 16.87 ng/mL — ABNORMAL LOW (ref 30–100)

## 2019-12-08 LAB — FERRITIN: Ferritin: 1386 ng/mL — ABNORMAL HIGH (ref 24–336)

## 2019-12-08 LAB — MAGNESIUM: Magnesium: 2.3 mg/dL (ref 1.7–2.4)

## 2019-12-08 LAB — D-DIMER, QUANTITATIVE: D-Dimer, Quant: 0.51 ug/mL-FEU — ABNORMAL HIGH (ref 0.00–0.50)

## 2019-12-08 LAB — PHOSPHORUS: Phosphorus: 11.6 mg/dL — ABNORMAL HIGH (ref 2.5–4.6)

## 2019-12-08 MED ORDER — VITAMIN D (ERGOCALCIFEROL) 1.25 MG (50000 UNIT) PO CAPS
50000.0000 [IU] | ORAL_CAPSULE | ORAL | Status: AC
  Administered 2019-12-09: 08:00:00 50000 [IU] via ORAL

## 2019-12-08 MED ORDER — SODIUM CHLORIDE 0.9 % IV SOLN: 100.0000 mL | INTRAVENOUS | Status: DC | PRN

## 2019-12-08 MED ORDER — PENTAFLUOROPROP-TETRAFLUOROETH EX AERO: 1.0000 "application " | INHALATION_SPRAY | CUTANEOUS | Status: DC | PRN

## 2019-12-08 MED ORDER — HEPARIN SODIUM (PORCINE) 5000 UNIT/ML IJ SOLN
5000.0000 [IU] | Freq: Three times a day (TID) | INTRAMUSCULAR | Status: DC
  Administered 2019-12-08 – 2019-12-11 (×9): 5000 [IU] via SUBCUTANEOUS
  Filled 2019-12-08 (×9): qty 1

## 2019-12-08 MED ORDER — LIDOCAINE-PRILOCAINE 2.5-2.5 % EX CREA: 1.0000 "application " | TOPICAL_CREAM | CUTANEOUS | Status: DC | PRN

## 2019-12-08 MED ORDER — LIDOCAINE HCL (PF) 1 % IJ SOLN: 5.0000 mL | INTRAMUSCULAR | Status: DC | PRN

## 2019-12-08 NOTE — Progress Notes (Signed)
Pt continues with dialysis treatment, denies c/o. IV Vancomycin administered by dialysis nurse.

## 2019-12-08 NOTE — Procedures (Signed)
    HEMODIALYSIS TREATMENT NOTE:  3.5 hour heparin-free treatment completed via left upper arm AVF. Goal met: 3.1 liters removed.  All blood was returned and hemostasis was achieved in 15 minutes.  Rockwell Alexandria, RN

## 2019-12-08 NOTE — Progress Notes (Signed)
[facility] Progress Note   58 y.o. male with a history including ESRD MWF at [facility], DM, and HTN who presented to the ER with progressive shortness of breath.  Patient was found to be covid 19 positive.  Bed was not available at [facility] and he is to be admitted to [facility] per the hospitalist service.  Spoke with HD unit RN and they have agreed to dialyze the patient as long as he meets [facility] admission triage requirements of less than 4 liters oxygen.  Called his outpatient HD unit.  Outpatient orders for HD are below.  He missed treatment on 12/28.  Last treatment was 12/26 and his departure weight was 131.8 kg.  Examined in person with appropriate PPE.  He states that he doesn't take hydralazine at home and doesn't know dose of clonidine but takes it once a day.  Assessment/ Plan:   Outpatient dialysis orders MWF 4 hours and 58minutes  2K/2.5 calcium bath  EDW is 132 kg  Heparin 3000 units load and hourly dose of 1200 units stop 1 hour before end of dialysis epogen 1200 units IVP three times weekly  hectorol 3.5 mcg three times weekly  venofer 50 mg once weekly   # ESRD  - HD normally is per MWF schedule  - Missed HD on 12/28  - Unable to HD on  12/29 bec of other emergency but he is definitely on the schedule for today. - Next HD Fri  # Covid 19 infection  - Therapies per primary team   - supportive care per primary team   # Acute hypoxic respiratory failure  - secondary to covid 19 infection - supplemental oxygen per primary team   # HTN  - Agree with holding anti-hypertensives for now given his hypotension - Note he is not actually taking hydralazine at home and only takes clonidine once a day - doesn't know dose to confirm  # Anemia  - no acute indication for ESA - follow trends  # Secondary hyperpara  - continue hectorol and binders - needs renal diet    Subjective:   States that his breathing is improved. Denies f/c/n/v/CP.    Objective:   BP 109/73 (BP Location: Right Arm)   Pulse 86   Temp 98.1 F (36.7 C) (Oral)   Resp 20   Ht 5\' 7"  (1.702 m)   Wt 133.8 kg   SpO2 98%   BMI 46.20 kg/m   Intake/Output Summary (Last 24 hours) at 12/08/2019 1455 Last data filed at 12/08/2019 1300 Gross per 24 hour  Intake 1037.83 ml  Output --  Net 1037.83 ml   Weight change: -0.918 kg  Physical Exam: General: adult male in bed seated NAD  HEENT: NCAT  Eyes: EOMI sclera anicteric Neck:  Supple trachea midline Heart:  RRR Lungs:  CTA b/l Abdomen: soft/obese/NT  Extremities: no pitting edema  Skin: no rash on extremities exposed  Neuro:  Alert and oriented x 3; very pleasant Access LUE AVF with thrill (appears to be a BBF that was not transposed)  Imaging: DG CHEST PORT 1 VIEW  Result Date: 12/07/2019 CLINICAL DATA:  COVID positive EXAM: PORTABLE CHEST 1 VIEW COMPARISON:  12/06/2019 FINDINGS: Cardiomegaly. There may be subtle heterogeneous airspace opacity in the bilateral lung bases. Osseous structures are unremarkable. IMPRESSION: 1. There may be subtle heterogeneous airspace opacity in the bilateral lung bases, generally in keeping with COVID-19 airspace disease. This appears new or increased compared to prior radiographs. PA and  lateral radiographs or CT may be helpful to further evaluate. 2.  Cardiomegaly. Electronically Signed   By: Eddie Candle M.D.   On: 12/07/2019 11:20   DG Chest Port 1 View  Result Date: 12/06/2019 CLINICAL DATA:  Shortness of breath EXAM: PORTABLE CHEST 1 VIEW COMPARISON:  04/23/2013 FINDINGS: Cardiomegaly with mild central congestion. No pleural effusion or overt pulmonary edema. No pneumothorax. IMPRESSION: Cardiomegaly with mild central congestion. No definite focal airspace disease. Electronically Signed   By: Donavan Foil M.D.   On: 12/06/2019 22:39    Labs: BMET Recent Labs  Lab 12/06/19 2235 12/07/19 0511 12/08/19 0607  NA 128* 130* 130*  K 5.0 4.9 5.5*  CL 87*  88* 87*  CO2 21* 18* 16*  GLUCOSE 135* 136* 143*  BUN 98* 102* 120*  CREATININE 16.90* 17.38* 20.09*  CALCIUM 7.9* 7.8* 7.7*  PHOS  --   --  11.6*   CBC Recent Labs  Lab 12/06/19 2235 12/07/19 0511 12/08/19 0607  WBC 3.1* 2.8* 3.0*  NEUTROABS 2.2  --  2.2  HGB 11.4* 11.6* 11.2*  HCT 34.5* 34.9* 33.4*  MCV 96.9 97.8 96.8  PLT 241 224 248    Medications:    . albuterol  2 puff Inhalation Q6H  . vitamin C  500 mg Oral Daily  . Chlorhexidine Gluconate Cloth  6 each Topical Q0600  . Chlorhexidine Gluconate Cloth  6 each Topical Q0600  . dexamethasone (DECADRON) injection  6 mg Intravenous Q24H  . doxercalciferol  3.5 mcg Intravenous Q M,W,F-HD  . heparin injection (subcutaneous)  5,000 Units Subcutaneous Q8H  . mouth rinse  15 mL Mouth Rinse BID  . [START ON 12/09/2019] Vitamin D (Ergocalciferol)  50,000 Units Oral Once per day on Mon Thu  . zinc sulfate  220 mg Oral Daily      Otelia Santee, MD 12/08/2019, 2:55 PM

## 2019-12-08 NOTE — Progress Notes (Signed)
PROGRESS NOTE [facility]   Jesus Williams  [id number]  DOB: 1961-09-10  DOA: 12/06/2019 PCP: Patient, No Pcp Per   Brief Admission Hx: 58 year old male with CKD, morbid obesity, OSA on CPAP, ESRD on HD presented to the emergency department with progressive shortness of breath for 1 week.  He had missed his hemodialysis session on Monday.  He tested Covid 19+.  MDM/Assessment & Plan:   1. Acute respiratory distress secondary to COVID-19-the patient has been started on remdesivir and supplemental oxygen and his symptoms have been improving.  He is also on steroids.  He is on supportive therapy.  Continue to monitor inflammatory markers.  Continue vitamin supplements. 2. ESRD on HD-patient missed hemodialysis on Monday and for some reason did not receive HD on December 07, 2019.  He will receive treatment today I confirmed with Dr. Augustin Coupe. 3. Type 2 diabetes mellitus-continue to monitor CBGs and continue SSI coverage and carb modified diet. 4. Chronic gout-he is chronically managed with allopurinol and has been stable. 5. Essential hypertension-well-controlled on current home meds.  Follow. 6. Hyponatremia-monitoring.  Treating volume with HD. 7. Anemia in CKD-continue to follow closely.  EPO per nephrology team.  DVT prophylaxis: Lovenox Code Status: Full Family Communication: Patient updated at bedside, verbalized understanding Disposition Plan: Inpatient   Consultants:  Nephrology  Procedures:    Antimicrobials:     Subjective: Patient says he is having much less shortness of breath now.  His cough is improving.  He is not having fever or chills.  Objective: Vitals:   12/07/19 2100 12/07/19 2130 12/07/19 2219 12/08/19 0546  BP: 103/63 106/76 (!) 147/91 109/73  Pulse: 88  94 86  Resp: 16 20 19 20   Temp:   98.2 F (36.8 C) 98.1 F (36.7 C)  TempSrc:   Oral Oral  SpO2: 94%  93% 98%  Weight:   133.8 kg   Height:   5\' 7"  (1.702 m)     Intake/Output  Summary (Last 24 hours) at 12/08/2019 1340 Last data filed at 12/08/2019 1300 Gross per 24 hour  Intake 1037.83 ml  Output --  Net 1037.83 ml   Filed Weights   12/06/19 2100 12/07/19 2219  Weight: 134.7 kg 133.8 kg   REVIEW OF SYSTEMS  As per history otherwise all reviewed and reported negative  Exam:  General exam: Chronically ill-appearing male he is sitting up in the bed he is awake and alert in no apparent distress. Respiratory system:  No increased work of breathing. Cardiovascular system: S1 & S2 heard. No JVD, murmurs, gallops, clicks or pedal edema. Gastrointestinal system: Abdomen is nondistended, soft and nontender. Normal bowel sounds heard. Central nervous system: Alert and oriented. No focal neurological deficits. Extremities: 1+ pretibial edema bilateral lower extremities.  Data Reviewed: Basic Metabolic Panel: Recent Labs  Lab 12/06/19 2235 12/07/19 0511 12/08/19 0607  NA 128* 130* 130*  K 5.0 4.9 5.5*  CL 87* 88* 87*  CO2 21* 18* 16*  GLUCOSE 135* 136* 143*  BUN 98* 102* 120*  CREATININE 16.90* 17.38* 20.09*  CALCIUM 7.9* 7.8* 7.7*  MG  --   --  2.3  PHOS  --   --  11.6*   Liver Function Tests: Recent Labs  Lab 12/06/19 2235 12/08/19 0607  AST 34 26  ALT 21 20  ALKPHOS 56 52  BILITOT 0.7 0.7  PROT 7.8 7.4  ALBUMIN 3.5 3.2*   No results for input(s): LIPASE, AMYLASE in the last 168 hours. No results for  input(s): AMMONIA in the last 168 hours. CBC: Recent Labs  Lab 12/06/19 2235 12/07/19 0511 12/08/19 0607  WBC 3.1* 2.8* 3.0*  NEUTROABS 2.2  --  2.2  HGB 11.4* 11.6* 11.2*  HCT 34.5* 34.9* 33.4*  MCV 96.9 97.8 96.8  PLT 241 224 248   Cardiac Enzymes: No results for input(s): CKTOTAL, CKMB, CKMBINDEX, TROPONINI in the last 168 hours. CBG (last 3)  Recent Labs    12/07/19 2242 12/08/19 0750 12/08/19 1103  GLUCAP 219* 117* 232*   Recent Results (from the past 240 hour(s))  SARS CORONAVIRUS 2 (TAT 6-24 HRS) Nasopharyngeal  Nasopharyngeal Swab     Status: Abnormal   Collection Time: 12/06/19 10:35 PM   Specimen: Nasopharyngeal Swab  Result Value Ref Range Status   SARS Coronavirus 2 POSITIVE (A) NEGATIVE Final    Comment: RESULT CALLED TO, READ BACK BY AND VERIFIED WITH: Jani Gravel RN 13:30 12/07/19 ([email or web address]) (NOTE) SARS-CoV-2 target nucleic acids are DETECTED. The SARS-CoV-2 RNA is generally detectable in upper and lower respiratory specimens during the acute phase of infection. Positive results are indicative of the presence of SARS-CoV-2 RNA. Clinical correlation with patient history and other diagnostic information is  necessary to determine patient infection status. Positive results do not rule out bacterial infection or co-infection with other viruses.  The expected result is Negative. Fact Sheet for Patients: SugarRoll.be Fact Sheet for Healthcare Providers: https://www.woods-mathews.com/ This test is not yet approved or cleared by the [place] FDA and  has been authorized for detection and/or diagnosis of SARS-CoV-2 by FDA under an Emergency Use Authorization (EUA). This EUA will remain  in effect (meaning this test can be used) for t he duration of the COVID-19 declaration under Section 564(b)(1) of the Act, 21 U.S.C. section 360bbb-3(b)(1), unless the authorization is terminated or revoked sooner. Performed at [facility], [place] [address], [place], [place] [address]   Blood Culture (routine x 2)     Status: None (Preliminary result)   Collection Time: 12/06/19 10:35 PM   Specimen: Right Antecubital; Blood  Result Value Ref Range Status   Specimen Description RIGHT ANTECUBITAL  Final   Special Requests   Final    BOTTLES DRAWN AEROBIC AND ANAEROBIC Blood Culture adequate volume   Culture   Final    NO GROWTH 2 DAYS Performed at [facility], [address] [place], [place] [address]    Report Status PENDING  Incomplete  Blood Culture  (routine x 2)     Status: None (Preliminary result)   Collection Time: 12/06/19 11:11 PM   Specimen: BLOOD RIGHT HAND  Result Value Ref Range Status   Specimen Description BLOOD RIGHT HAND  Final   Special Requests   Final    BOTTLES DRAWN AEROBIC AND ANAEROBIC Blood Culture adequate volume   Culture   Final    NO GROWTH 2 DAYS Performed at [facility], [address] [place], [place] [address]    Report Status PENDING  Incomplete  Respiratory Panel by RT PCR (Flu A&B, Covid) - Nasopharyngeal Swab     Status: Abnormal   Collection Time: 12/07/19  4:54 AM   Specimen: Nasopharyngeal Swab  Result Value Ref Range Status   SARS Coronavirus 2 by RT PCR POSITIVE (A) NEGATIVE Final    Comment: RESULT CALLED TO, READ BACK BY AND VERIFIED WITH: SHORE L. AT [id number] ON [id number] BY THOMPSON S.    Influenza A by PCR NEGATIVE NEGATIVE Final   Influenza B by PCR NEGATIVE  NEGATIVE Final    Comment: (NOTE) The Xpert Xpress SARS-CoV-2/FLU/RSV assay is intended as an aid in  the diagnosis of influenza from Nasopharyngeal swab specimens and  should not be used as a sole basis for treatment. Nasal washings and  aspirates are unacceptable for Xpert Xpress SARS-CoV-2/FLU/RSV  testing. Fact Sheet for Patients: PinkCheek.be Fact Sheet for Healthcare Providers: GravelBags.it This test is not yet approved or cleared by the [place] FDA and  has been authorized for detection and/or diagnosis of SARS-CoV-2 by  FDA under an Emergency Use Authorization (EUA). This EUA will remain  in effect (meaning this test can be used) for the duration of the  Covid-19 declaration under Section 564(b)(1) of the Act, 21  U.S.C. section 360bbb-3(b)(1), unless the authorization is  terminated or revoked. Performed at [facility], [address] [place], [place] [address]      Studies: DG CHEST PORT 1 VIEW  Result Date: 12/07/2019 CLINICAL DATA:  COVID  positive EXAM: PORTABLE CHEST 1 VIEW COMPARISON:  12/06/2019 FINDINGS: Cardiomegaly. There may be subtle heterogeneous airspace opacity in the bilateral lung bases. Osseous structures are unremarkable. IMPRESSION: 1. There may be subtle heterogeneous airspace opacity in the bilateral lung bases, generally in keeping with COVID-19 airspace disease. This appears new or increased compared to prior radiographs. PA and lateral radiographs or CT may be helpful to further evaluate. 2.  Cardiomegaly. Electronically Signed   By: Eddie Candle M.D.   On: 12/07/2019 11:20   DG Chest Port 1 View  Result Date: 12/06/2019 CLINICAL DATA:  Shortness of breath EXAM: PORTABLE CHEST 1 VIEW COMPARISON:  04/23/2013 FINDINGS: Cardiomegaly with mild central congestion. No pleural effusion or overt pulmonary edema. No pneumothorax. IMPRESSION: Cardiomegaly with mild central congestion. No definite focal airspace disease. Electronically Signed   By: Donavan Foil M.D.   On: 12/06/2019 22:39     Scheduled Meds: . albuterol  2 puff Inhalation Q6H  . vitamin C  500 mg Oral Daily  . Chlorhexidine Gluconate Cloth  6 each Topical Q0600  . Chlorhexidine Gluconate Cloth  6 each Topical Q0600  . dexamethasone (DECADRON) injection  6 mg Intravenous Q24H  . doxercalciferol  3.5 mcg Intravenous Q M,W,F-HD  . heparin injection (subcutaneous)  5,000 Units Subcutaneous Q8H  . mouth rinse  15 mL Mouth Rinse BID  . [START ON 12/09/2019] Vitamin D (Ergocalciferol)  50,000 Units Oral Once per day on Mon Thu  . zinc sulfate  220 mg Oral Daily   Continuous Infusions: . piperacillin-tazobactam (ZOSYN)  IV 3.375 g (12/08/19 1228)  . remdesivir 100 mg in NS 100 mL 100 mg (12/08/19 1135)  . vancomycin      Principal Problem:   COVID-19 Active Problems:   SIRS (systemic inflammatory response syndrome) (HCC)   Hyperlipidemia   Gout   Hypertension   Hyponatremia   Anemia   Leukopenia   Type 2 diabetes mellitus ([place])   Acute  respiratory failure with hypoxia ([place])  Time spent:   Irwin Brakeman, MD [facility] 12/08/2019, 1:40 PM    LOS: 1 day  How to contact the [facility] Attending or Consulting provider [place] or covering provider during after hours [place], for this patient?  1. Check the care team in [facility] and look for a) attending/consulting TR[facility] provider listed and b) the [facility] team listed 2. Log into [email or web address] and use [place]'s universal password to access. If you do not have the password, please contact the hospital operator. 3. Locate  the [facility] provider you are looking for under [facility] and page to a number that you can be directly reached. 4. If you still have difficulty reaching the provider, please page the [facility] (Director on Call) for the Hospitalists listed on amion for assistance.

## 2019-12-09 ENCOUNTER — Inpatient Hospital Stay (HOSPITAL_COMMUNITY): Payer: No Typology Code available for payment source

## 2019-12-09 DIAGNOSIS — E785 Hyperlipidemia, unspecified: Secondary | ICD-10-CM

## 2019-12-09 LAB — COMPREHENSIVE METABOLIC PANEL
ALT: 20 U/L (ref 0–44)
AST: 21 U/L (ref 15–41)
Albumin: 3.3 g/dL — ABNORMAL LOW (ref 3.5–5.0)
Alkaline Phosphatase: 62 U/L (ref 38–126)
BUN: 84 mg/dL — ABNORMAL HIGH (ref 6–20)
CO2: 19 mmol/L — ABNORMAL LOW (ref 22–32)
Calcium: 8.4 mg/dL — ABNORMAL LOW (ref 8.9–10.3)
Chloride: 88 mmol/L — ABNORMAL LOW (ref 98–111)
Creatinine, Ser: 14.67 mg/dL — ABNORMAL HIGH (ref 0.61–1.24)
GFR calc Af Amer: 4 mL/min — ABNORMAL LOW (ref >60)
GFR calc non Af Amer: 3 mL/min — ABNORMAL LOW (ref >60)
Glucose, Bld: 167 mg/dL — ABNORMAL HIGH (ref 70–99)
Potassium: 4.4 mmol/L (ref 3.5–5.1)
Sodium: 134 mmol/L — ABNORMAL LOW (ref 135–145)
Total Bilirubin: 1 mg/dL (ref 0.3–1.2)
Total Protein: 7.4 g/dL (ref 6.5–8.1)

## 2019-12-09 LAB — CBC WITH DIFFERENTIAL/PLATELET
Abs Immature Granulocytes: 0.03 10*3/uL (ref 0.00–0.07)
Basophils Absolute: 0 10*3/uL (ref 0.0–0.1)
Basophils Relative: 0 %
Eosinophils Absolute: 0 10*3/uL (ref 0.0–0.5)
Eosinophils Relative: 0 %
HCT: 36.5 % — ABNORMAL LOW (ref 39.0–52.0)
Hemoglobin: 12.3 g/dL — ABNORMAL LOW (ref 13.0–17.0)
Immature Granulocytes: 1 %
Lymphocytes Relative: 7 %
Lymphs Abs: 0.4 10*3/uL — ABNORMAL LOW (ref 0.7–4.0)
MCH: 32.4 pg (ref 26.0–34.0)
MCHC: 33.7 g/dL (ref 30.0–36.0)
MCV: 96.1 fL (ref 80.0–100.0)
Monocytes Absolute: 0.6 10*3/uL (ref 0.1–1.0)
Monocytes Relative: 11 %
Neutro Abs: 4 10*3/uL (ref 1.7–7.7)
Neutrophils Relative %: 81 %
Platelets: 319 10*3/uL (ref 150–400)
RBC: 3.8 MIL/uL — ABNORMAL LOW (ref 4.22–5.81)
RDW: 15.3 % (ref 11.5–15.5)
WBC: 5 10*3/uL (ref 4.0–10.5)
nRBC: 0 % (ref 0.0–0.2)

## 2019-12-09 LAB — MAGNESIUM: Magnesium: 2.4 mg/dL (ref 1.7–2.4)

## 2019-12-09 LAB — GLUCOSE, CAPILLARY
Glucose-Capillary: 148 mg/dL — ABNORMAL HIGH (ref 70–99)
Glucose-Capillary: 216 mg/dL — ABNORMAL HIGH (ref 70–99)
Glucose-Capillary: 170 mg/dL — ABNORMAL HIGH (ref 70–99)
Glucose-Capillary: 306 mg/dL — ABNORMAL HIGH (ref 70–99)

## 2019-12-09 LAB — FERRITIN: Ferritin: 1902 ng/mL — ABNORMAL HIGH (ref 24–336)

## 2019-12-09 LAB — PHOSPHORUS: Phosphorus: 10.4 mg/dL — ABNORMAL HIGH (ref 2.5–4.6)

## 2019-12-09 LAB — C-REACTIVE PROTEIN: CRP: 7.3 mg/dL — ABNORMAL HIGH (ref <1.0)

## 2019-12-09 LAB — D-DIMER, QUANTITATIVE: D-Dimer, Quant: 2.77 ug/mL-FEU — ABNORMAL HIGH (ref 0.00–0.50)

## 2019-12-09 MED ORDER — INSULIN ASPART 100 UNIT/ML ~~LOC~~ SOLN
0.0000 [IU] | Freq: Every day | SUBCUTANEOUS | Status: DC
  Administered 2019-12-09 – 2019-12-10 (×2): 4 [IU] via SUBCUTANEOUS

## 2019-12-09 MED ORDER — INSULIN ASPART 100 UNIT/ML ~~LOC~~ SOLN
6.0000 [IU] | Freq: Three times a day (TID) | SUBCUTANEOUS | Status: DC
  Administered 2019-12-09 – 2019-12-11 (×7): 6 [IU] via SUBCUTANEOUS

## 2019-12-09 MED ORDER — ATORVASTATIN CALCIUM 40 MG PO TABS
80.0000 mg | ORAL_TABLET | Freq: Every day | ORAL | Status: DC
  Administered 2019-12-09 – 2019-12-10 (×2): 80 mg via ORAL
  Filled 2019-12-09 (×2): qty 2

## 2019-12-09 MED ORDER — INSULIN ASPART 100 UNIT/ML ~~LOC~~ SOLN
0.0000 [IU] | Freq: Three times a day (TID) | SUBCUTANEOUS | Status: DC
  Administered 2019-12-09: 12:00:00 5 [IU] via SUBCUTANEOUS
  Administered 2019-12-09: 17:00:00 3 [IU] via SUBCUTANEOUS
  Administered 2019-12-09: 08:00:00 2 [IU] via SUBCUTANEOUS
  Administered 2019-12-10: 09:00:00 3 [IU] via SUBCUTANEOUS
  Administered 2019-12-10 (×2): 5 [IU] via SUBCUTANEOUS
  Administered 2019-12-11: 08:00:00 3 [IU] via SUBCUTANEOUS

## 2019-12-09 MED ORDER — SEVELAMER CARBONATE 800 MG PO TABS
800.0000 mg | ORAL_TABLET | Freq: Three times a day (TID) | ORAL | Status: DC
  Administered 2019-12-09 – 2019-12-11 (×5): 800 mg via ORAL
  Filled 2019-12-09 (×5): qty 1

## 2019-12-09 MED ORDER — ALLOPURINOL 100 MG PO TABS
100.0000 mg | ORAL_TABLET | Freq: Every day | ORAL | Status: DC
  Administered 2019-12-09 – 2019-12-11 (×3): 100 mg via ORAL
  Filled 2019-12-09 (×3): qty 1

## 2019-12-09 MED ORDER — DOXYCYCLINE HYCLATE 100 MG PO TABS
100.0000 mg | ORAL_TABLET | Freq: Two times a day (BID) | ORAL | Status: AC
  Administered 2019-12-09 – 2019-12-10 (×4): 100 mg via ORAL
  Filled 2019-12-09 (×4): qty 1

## 2019-12-09 NOTE — Progress Notes (Addendum)
PROGRESS NOTE  CAMPUS   Jesus Williams  [id number]  DOB: 06-01-1961  DOA: 12/06/2019 PCP: Patient, No Pcp Per   Brief Admission Hx: 58 year old male with CKD, morbid obesity, OSA on CPAP, ESRD on HD presented to the emergency department with progressive shortness of breath for 1 week.  He had missed his hemodialysis session on Monday.  He tested Covid 19+.  MDM/Assessment & Plan:   1. Covid Pneumonia / Acute respiratory distress secondary to COVID-19-the patient has been started on remdesivir and supplemental oxygen and his symptoms have been improving.  He is also on steroids.  He is on supportive therapy.  Continue to monitor inflammatory markers.  Continue vitamin supplements. CRP trending down.  2. ESRD on HD-patient missed hemodialysis on Monday but received HD 12/30. He may have to have to go to a different unit to dialyze temporarily due to Covid infection. TOC team working on it with nephrology.  He likely will dialyze at facility in [place]. 3. Type 2 diabetes mellitus-continue to monitor CBGs and continue SSI coverage and carb modified diet. 4. Chronic gout-he is chronically managed with allopurinol and has been stable. 5. Essential hypertension-holding home meds due to soft BPs.  Follow. 6. Hyponatremia-improving with volume removal.  Treating volume with HD. 7. Anemia in CKD-continue to follow closely.  EPO per nephrology team. 8. Vitamin D deficiency - supplement ordered.   DVT prophylaxis: Lovenox Code Status: Full Family Communication: Patient updated at bedside, verbalized understanding Disposition Plan: Inpatient  Consultants:  Nephrology  Procedures:    Antimicrobials:     Subjective: Patient not having SOB symptoms anymore and tolerating HD.  He is asking about outpatient HD now that he has Covid.   Objective: Vitals:   12/08/19 1830 12/08/19 1900 12/08/19 2207 12/09/19 0535  BP: 112/78 124/73 139/82 127/71  Pulse: 78 77 82 80   Resp:   20 20  Temp:   (!) 97.5 F (36.4 C) 98.1 F (36.7 C)  TempSrc:   Oral Oral  SpO2:   100% 100%  Weight:      Height:        Intake/Output Summary (Last 24 hours) at 12/09/2019 1122 Last data filed at 12/08/2019 1850 Gross per 24 hour  Intake 240 ml  Output 3200 ml  Net -2960 ml   Filed Weights   12/06/19 2100 12/07/19 2219 12/08/19 1515  Weight: 134.7 kg 133.8 kg 134.6 kg   REVIEW OF SYSTEMS  As per history otherwise all reviewed and reported negative  Exam:  General exam: Chronically ill-appearing male he is sitting up in the bed he is awake and alert in no apparent distress. Respiratory system:  No increased work of breathing. Cardiovascular system: S1 & S2 heard. No JVD, murmurs, gallops, clicks or pedal edema. Gastrointestinal system: Abdomen is nondistended, soft and nontender. Normal bowel sounds heard. Central nervous system: Alert and oriented. No focal neurological deficits. Extremities: trace pretibial edema bilateral lower extremities.  Data Reviewed: Basic Metabolic Panel: Recent Labs  Lab 12/06/19 2235 12/07/19 0511 12/08/19 0607 12/09/19 0654  NA 128* 130* 130* 134*  K 5.0 4.9 5.5* 4.4  CL 87* 88* 87* 88*  CO2 21* 18* 16* 19*  GLUCOSE 135* 136* 143* 167*  BUN 98* 102* 120* 84*  CREATININE 16.90* 17.38* 20.09* 14.67*  CALCIUM 7.9* 7.8* 7.7* 8.4*  MG  --   --  2.3 2.4  PHOS  --   --  11.6* 10.4*   Liver Function Tests: Recent Labs  Lab 12/06/19 2235 12/08/19 0607 12/09/19 0654  AST 34 26 21  ALT 21 20 20   ALKPHOS 56 52 62  BILITOT 0.7 0.7 1.0  PROT 7.8 7.4 7.4  ALBUMIN 3.5 3.2* 3.3*   No results for input(s): LIPASE, AMYLASE in the last 168 hours. No results for input(s): AMMONIA in the last 168 hours. CBC: Recent Labs  Lab 12/06/19 2235 12/07/19 0511 12/08/19 0607  WBC 3.1* 2.8* 3.0*  NEUTROABS 2.2  --  2.2  HGB 11.4* 11.6* 11.2*  HCT 34.5* 34.9* 33.4*  MCV 96.9 97.8 96.8  PLT 241 224 248   Cardiac Enzymes: No  results for input(s): CKTOTAL, CKMB, CKMBINDEX, TROPONINI in the last 168 hours. CBG (last 3)  Recent Labs    12/08/19 1610 12/08/19 2230 12/09/19 0751  GLUCAP 167* 324* 148*   Recent Results (from the past 240 hour(s))  SARS CORONAVIRUS 2 (TAT 6-24 HRS) Nasopharyngeal Nasopharyngeal Swab     Status: Abnormal   Collection Time: 12/06/19 10:35 PM   Specimen: Nasopharyngeal Swab  Result Value Ref Range Status   SARS Coronavirus 2 POSITIVE (A) NEGATIVE Final    Comment: RESULT CALLED TO, READ BACK BY AND VERIFIED WITH: Jani Gravel RN 13:30 12/07/19 ([email or web address]) (NOTE) SARS-CoV-2 target nucleic acids are DETECTED. The SARS-CoV-2 RNA is generally detectable in upper and lower respiratory specimens during the acute phase of infection. Positive results are indicative of the presence of SARS-CoV-2 RNA. Clinical correlation with patient history and other diagnostic information is  necessary to determine patient infection status. Positive results do not rule out bacterial infection or co-infection with other viruses.  The expected result is Negative. Fact Sheet for Patients: SugarRoll.be Fact Sheet for Healthcare Providers: https://www.woods-mathews.com/ This test is not yet approved or cleared by the [place] FDA and  has been authorized for detection and/or diagnosis of SARS-CoV-2 by FDA under an Emergency Use Authorization (EUA). This EUA will remain  in effect (meaning this test can be used) for t he duration of the COVID-19 declaration under Section 564(b)(1) of the Act, 21 U.S.C. section 360bbb-3(b)(1), unless the authorization is terminated or revoked sooner. Performed at [facility], [place] [address] [place], [place] [address]   Blood Culture (routine x 2)     Status: None (Preliminary result)   Collection Time: 12/06/19 10:35 PM   Specimen: Right Antecubital; Blood  Result Value Ref Range Status   Specimen Description RIGHT  ANTECUBITAL  Final   Special Requests   Final    BOTTLES DRAWN AEROBIC AND ANAEROBIC Blood Culture adequate volume   Culture   Final    NO GROWTH 3 DAYS Performed at [facility], [address], [place], [place] [address]    Report Status PENDING  Incomplete  Blood Culture (routine x 2)     Status: None (Preliminary result)   Collection Time: 12/06/19 11:11 PM   Specimen: BLOOD RIGHT HAND  Result Value Ref Range Status   Specimen Description BLOOD RIGHT HAND  Final   Special Requests   Final    BOTTLES DRAWN AEROBIC AND ANAEROBIC Blood Culture adequate volume   Culture   Final    NO GROWTH 3 DAYS Performed at [facility], [address], [place], [place] [address]    Report Status PENDING  Incomplete  Respiratory Panel by RT PCR (Flu A&B, Covid) - Nasopharyngeal Swab     Status: Abnormal   Collection Time: 12/07/19  4:54 AM   Specimen: Nasopharyngeal Swab  Result Value Ref  Range Status   SARS Coronavirus 2 by RT PCR POSITIVE (A) NEGATIVE Final    Comment: RESULT CALLED TO, READ BACK BY AND VERIFIED WITH: SHORE L. AT 0734A ON [id number] BY THOMPSON S.    Influenza A by PCR NEGATIVE NEGATIVE Final   Influenza B by PCR NEGATIVE NEGATIVE Final    Comment: (NOTE) The Xpert Xpress SARS-CoV-2/FLU/RSV assay is intended as an aid in  the diagnosis of influenza from Nasopharyngeal swab specimens and  should not be used as a sole basis for treatment. Nasal washings and  aspirates are unacceptable for Xpert Xpress SARS-CoV-2/FLU/RSV  testing. Fact Sheet for Patients: PinkCheek.be Fact Sheet for Healthcare Providers: GravelBags.it This test is not yet approved or cleared by the [place] FDA and  has been authorized for detection and/or diagnosis of SARS-CoV-2 by  FDA under an Emergency Use Authorization (EUA). This EUA will remain  in effect (meaning this test can be used) for the duration of the  Covid-19 declaration under  Section 564(b)(1) of the Act, 21  U.S.C. section 360bbb-3(b)(1), unless the authorization is  terminated or revoked. Performed at [facility], [address], [place], [place] [address]      Studies: DG CHEST PORT 1 VIEW  Result Date: 12/09/2019 CLINICAL DATA:  COVID-19 pneumonia EXAM: PORTABLE CHEST 1 VIEW COMPARISON:  Two days ago FINDINGS: Cardiomegaly. Improved aeration. There is no edema, consolidation, effusion, or pneumothorax. IMPRESSION: 1. Improved aeration. 2. Cardiomegaly without failure. Electronically Signed   By: Monte Fantasia M.D.   On: 12/09/2019 07:15     Scheduled Meds: . albuterol  2 puff Inhalation Q6H  . vitamin C  500 mg Oral Daily  . Chlorhexidine Gluconate Cloth  6 each Topical Q0600  . Chlorhexidine Gluconate Cloth  6 each Topical Q0600  . dexamethasone (DECADRON) injection  6 mg Intravenous Q24H  . doxercalciferol  3.5 mcg Intravenous Q M,W,F-HD  . heparin injection (subcutaneous)  5,000 Units Subcutaneous Q8H  . insulin aspart  0-15 Units Subcutaneous TID WC  . insulin aspart  0-5 Units Subcutaneous QHS  . insulin aspart  6 Units Subcutaneous TID WC  . mouth rinse  15 mL Mouth Rinse BID  . zinc sulfate  220 mg Oral Daily   Continuous Infusions: . sodium chloride    . sodium chloride    . piperacillin-tazobactam (ZOSYN)  IV 3.375 g (12/09/19 0834)  . remdesivir 100 mg in NS 100 mL 100 mg (12/08/19 1135)  . vancomycin 1,000 mg (12/08/19 1750)    Principal Problem:   COVID-19 Active Problems:   SIRS (systemic inflammatory response syndrome) (HCC)   Hyperlipidemia   Gout   Hypertension   Hyponatremia   Anemia   Leukopenia   Type 2 diabetes mellitus ([place])   Acute respiratory failure with hypoxia ([place])  Time spent:   Irwin Brakeman, MD [facility] 12/09/2019, 11:22 AM    LOS: 2 days  How to contact the [facility] Attending or Consulting provider [place] or covering provider during after hours [place], for this patient?  1. Check the  care team in [facility] and look for a) attending/consulting TRH provider listed and b) the [facility] team listed 2. Log into [email or web address] and use [place]'s universal password to access. If you do not have the password, please contact the hospital operator. 3. Locate the [facility] provider you are looking for under [facility] and page to a number that you can be directly reached. 4. If you still have difficulty reaching  the provider, please page the [facility] (Director on Call) for the Hospitalists listed on amion for assistance.

## 2019-12-09 NOTE — TOC Progression Note (Signed)
Transition of Care [facility]) - Progression Note    Patient Details  Name: Jesus Williams MRN: [id number] Date of Birth: 12-24-1960  Transition of Care [facility]) CM/SW Contact  Edrees Valent, Chauncey Reading, RN Phone Number: 12/09/2019, 1:56 PM  Clinical Narrative:  58 y.o. male with medical history significant of morbid obesity, OSA not on CPAP, CKD, gout, hyperlipidemia, hypertension ESRD on HD.   Covid +.   Will need to transfer from [place] [facility] to [facility] in [place]. Discussed with patient, he is agreeable to plan, he drives himself to HD sessions.   Spoke with Demetria of [facility], patient will have to be on a TTS schedule. Will fax over covid positive result and DC summary when available. They can accept patient this Saturday at 11, but they will need to be notified when he discharges. If he does not discharge until Saturday, he would need to DC early so he can make it to clinic by 11 am.     Expected Discharge Plan: Home/Self Care Barriers to Discharge: Continued Medical Work up  Expected Discharge Plan and Services Expected Discharge Plan: Home/Self Care   Discharge Planning Services: CM Consult, Other - See comment(new OP dialysis center)          Social Determinants of Health (SDOH) Interventions    Readmission Risk Interventions No flowsheet data found.

## 2019-12-09 NOTE — Progress Notes (Addendum)
[facility] Progress Note   58 y.o. male with a history including ESRD MWF at [facility], DM, and HTN who presented to the ER with progressive shortness of breath.  Patient was found to be covid 19 positive.  Bed was not available at [facility] and he is to be admitted to [facility] per the hospitalist service.  Spoke with HD unit RN and they have agreed to dialyze the patient as long as he meets [facility]'s admission triage requirements of less than 4 liters oxygen.  Called his outpatient HD unit.  Outpatient orders for HD are below.  He missed treatment on 12/28.  Last treatment was 12/26 and his departure weight was 131.8 kg.  Examined in person with appropriate PPE.  He states that he doesn't take hydralazine at home and doesn't know dose of clonidine but takes it once a day.  Assessment/ Plan:   Outpatient dialysis orders MWF 4 hours and 21minutes  2K/2.5 calcium bath  EDW is 132 kg  Heparin 3000 units load and hourly dose of 1200 units stop 1 hour before end of dialysis epogen 1200 units IVP three times weekly  hectorol 3.5 mcg three times weekly  venofer 50 mg once weekly   # ESRD  - HD normally is per MWF schedule, cont on schedule: 4h, 2K, 3LUF, Tight heparin, AVF  # Covid 19 infection  - Therapies per primary team   - supportive care per primary team   # Acute hypoxic respiratory failure  - secondary to covid 19 infection - supplemental oxygen per primary team   # HTN  - Agree with holding anti-hypertensives for now given his hypotension - Note he is not actually taking hydralazine at home and only takes clonidine once a day - doesn't know dose to confirm  # Anemia  - no acute indication for ESA - follow trends  # Secondary hyperpara  - continue hectorol and binders - needs renal diet    SIRS, on Vanc/Zosyn, per TRH, BCx NGTD.  AF  Subjective:   No c/o 2.5L [place] On Dex/Remdesivir    Objective:   BP 127/71 (BP Location: Right Arm)   Pulse 80    Temp 98.1 F (36.7 C) (Oral)   Resp 20   Ht 5\' 7"  (1.702 m)   Wt 134.6 kg   SpO2 100%   BMI 46.48 kg/m   Intake/Output Summary (Last 24 hours) at 12/09/2019 [id number] Last data filed at 12/08/2019 1850 Gross per 24 hour  Intake 240 ml  Output 3200 ml  Net -2960 ml   Weight change: 0.8 kg  Physical Exam: General: adult male in bed seated NAD  HEENT: NCAT  Eyes: EOMI sclera anicteric Neck:  Supple trachea midline Heart:  RRR Lungs:  CTA b/l Abdomen: soft/obese/NT  Extremities: no pitting edema  Skin: no rash on extremities exposed  Neuro:  Alert and oriented x 3; very pleasant Access LUE AVF with thrill (appears to be a BBF that was not transposed)  Imaging: DG CHEST PORT 1 VIEW  Result Date: 12/09/2019 CLINICAL DATA:  COVID-19 pneumonia EXAM: PORTABLE CHEST 1 VIEW COMPARISON:  Two days ago FINDINGS: Cardiomegaly. Improved aeration. There is no edema, consolidation, effusion, or pneumothorax. IMPRESSION: 1. Improved aeration. 2. Cardiomegaly without failure. Electronically Signed   By: Monte Fantasia M.D.   On: 12/09/2019 07:15   DG CHEST PORT 1 VIEW  Result Date: 12/07/2019 CLINICAL DATA:  COVID positive EXAM: PORTABLE CHEST 1 VIEW COMPARISON:  12/06/2019 FINDINGS: Cardiomegaly.  There may be subtle heterogeneous airspace opacity in the bilateral lung bases. Osseous structures are unremarkable. IMPRESSION: 1. There may be subtle heterogeneous airspace opacity in the bilateral lung bases, generally in keeping with COVID-19 airspace disease. This appears new or increased compared to prior radiographs. PA and lateral radiographs or CT may be helpful to further evaluate. 2.  Cardiomegaly. Electronically Signed   By: Eddie Candle M.D.   On: 12/07/2019 11:20    Labs: BMET Recent Labs  Lab 12/06/19 2235 12/07/19 0511 12/08/19 0607  NA 128* 130* 130*  K 5.0 4.9 5.5*  CL 87* 88* 87*  CO2 21* 18* 16*  GLUCOSE 135* 136* 143*  BUN 98* 102* 120*  CREATININE 16.90* 17.38* 20.09*   CALCIUM 7.9* 7.8* 7.7*  PHOS  --   --  11.6*   CBC Recent Labs  Lab 12/06/19 2235 12/07/19 0511 12/08/19 0607  WBC 3.1* 2.8* 3.0*  NEUTROABS 2.2  --  2.2  HGB 11.4* 11.6* 11.2*  HCT 34.5* 34.9* 33.4*  MCV 96.9 97.8 96.8  PLT 241 224 248    Medications:    . albuterol  2 puff Inhalation Q6H  . vitamin C  500 mg Oral Daily  . Chlorhexidine Gluconate Cloth  6 each Topical Q0600  . Chlorhexidine Gluconate Cloth  6 each Topical Q0600  . dexamethasone (DECADRON) injection  6 mg Intravenous Q24H  . doxercalciferol  3.5 mcg Intravenous Q M,W,F-HD  . heparin injection (subcutaneous)  5,000 Units Subcutaneous Q8H  . insulin aspart  0-15 Units Subcutaneous TID WC  . insulin aspart  0-5 Units Subcutaneous QHS  . insulin aspart  6 Units Subcutaneous TID WC  . mouth rinse  15 mL Mouth Rinse BID  . zinc sulfate  220 mg Oral Daily      Rexene Agent , MD 12/09/2019, 9:08 AM

## 2019-12-10 DIAGNOSIS — J1282 Pneumonia due to coronavirus disease 2019: Secondary | ICD-10-CM

## 2019-12-10 LAB — CBC WITH DIFFERENTIAL/PLATELET
Abs Immature Granulocytes: 0.06 10*3/uL (ref 0.00–0.07)
Basophils Absolute: 0 10*3/uL (ref 0.0–0.1)
Basophils Relative: 0 %
Eosinophils Absolute: 0 10*3/uL (ref 0.0–0.5)
Eosinophils Relative: 0 %
HCT: 38 % — ABNORMAL LOW (ref 39.0–52.0)
Hemoglobin: 12.6 g/dL — ABNORMAL LOW (ref 13.0–17.0)
Immature Granulocytes: 1 %
Lymphocytes Relative: 7 %
Lymphs Abs: 0.4 10*3/uL — ABNORMAL LOW (ref 0.7–4.0)
MCH: 32.1 pg (ref 26.0–34.0)
MCHC: 33.2 g/dL (ref 30.0–36.0)
MCV: 96.7 fL (ref 80.0–100.0)
Monocytes Absolute: 0.6 10*3/uL (ref 0.1–1.0)
Monocytes Relative: 10 %
Neutro Abs: 5.4 10*3/uL (ref 1.7–7.7)
Neutrophils Relative %: 82 %
Platelets: 354 10*3/uL (ref 150–400)
RBC: 3.93 MIL/uL — ABNORMAL LOW (ref 4.22–5.81)
RDW: 15.1 % (ref 11.5–15.5)
WBC: 6.5 10*3/uL (ref 4.0–10.5)
nRBC: 0 % (ref 0.0–0.2)

## 2019-12-10 LAB — COMPREHENSIVE METABOLIC PANEL
ALT: 19 U/L (ref 0–44)
AST: 20 U/L (ref 15–41)
Albumin: 3.4 g/dL — ABNORMAL LOW (ref 3.5–5.0)
Alkaline Phosphatase: 77 U/L (ref 38–126)
Anion gap: >20 — ABNORMAL HIGH (ref 5–15)
BUN: 99 mg/dL — ABNORMAL HIGH (ref 6–20)
CO2: 18 mmol/L — ABNORMAL LOW (ref 22–32)
Calcium: 8 mg/dL — ABNORMAL LOW (ref 8.9–10.3)
Chloride: 87 mmol/L — ABNORMAL LOW (ref 98–111)
Creatinine, Ser: 16.83 mg/dL — ABNORMAL HIGH (ref 0.61–1.24)
GFR calc Af Amer: 3 mL/min — ABNORMAL LOW (ref >60)
GFR calc non Af Amer: 3 mL/min — ABNORMAL LOW (ref >60)
Glucose, Bld: 217 mg/dL — ABNORMAL HIGH (ref 70–99)
Potassium: 4.6 mmol/L (ref 3.5–5.1)
Sodium: 130 mmol/L — ABNORMAL LOW (ref 135–145)
Total Bilirubin: 0.9 mg/dL (ref 0.3–1.2)
Total Protein: 7.8 g/dL (ref 6.5–8.1)

## 2019-12-10 LAB — D-DIMER, QUANTITATIVE: D-Dimer, Quant: 0.55 ug/mL-FEU — ABNORMAL HIGH (ref 0.00–0.50)

## 2019-12-10 LAB — GLUCOSE, CAPILLARY
Glucose-Capillary: 186 mg/dL — ABNORMAL HIGH (ref 70–99)
Glucose-Capillary: 239 mg/dL — ABNORMAL HIGH (ref 70–99)
Glucose-Capillary: 229 mg/dL — ABNORMAL HIGH (ref 70–99)
Glucose-Capillary: 316 mg/dL — ABNORMAL HIGH (ref 70–99)

## 2019-12-10 LAB — C-REACTIVE PROTEIN: CRP: 5.2 mg/dL — ABNORMAL HIGH (ref <1.0)

## 2019-12-10 LAB — MAGNESIUM: Magnesium: 2.3 mg/dL (ref 1.7–2.4)

## 2019-12-10 LAB — PHOSPHORUS: Phosphorus: 12 mg/dL — ABNORMAL HIGH (ref 2.5–4.6)

## 2019-12-10 LAB — FERRITIN: Ferritin: 1481 ng/mL — ABNORMAL HIGH (ref 24–336)

## 2019-12-10 MED ORDER — ALBUTEROL SULFATE HFA 108 (90 BASE) MCG/ACT IN AERS
2.0000 | INHALATION_SPRAY | Freq: Four times a day (QID) | RESPIRATORY_TRACT | Status: DC | PRN
  Filled 2019-12-10: qty 6.7

## 2019-12-10 MED ORDER — SODIUM CHLORIDE 0.9 % IV SOLN
100.0000 mg | Freq: Once | INTRAVENOUS | Status: AC
  Administered 2019-12-11: 06:00:00 100 mg via INTRAVENOUS
  Filled 2019-12-10: qty 20

## 2019-12-10 MED ORDER — HEPARIN SODIUM (PORCINE) 1000 UNIT/ML DIALYSIS: 20.0000 [IU]/kg | INTRAMUSCULAR | Status: DC | PRN

## 2019-12-10 NOTE — Progress Notes (Signed)
[facility] Progress Note   44M ESRD [facility] with COVID19 Assessment/ Plan:   Outpatient dialysis orders MWF 4 hours and 2minutes [facility] 2K/2.5 calcium bath  EDW is 132 kg  Heparin 3000 units load and hourly dose of 1200 units stop 1 hour before end of dialysis epogen 1200 units IVP three times weekly  hectorol 3.5 mcg three times weekly  venofer 50 mg once weekly   # ESRD  - HD normally is per MWF schedule, cont on schedule: 4h, 2K, 3LUF, Tight heparin, AVF - Given COVID19 will be THS at [facility] 2nd shift - Tentative plan for HD tomorrow, can DC in AM and go to outpt HD  # Covid 19 infection  - Therapies per primary team   - Finished remdesivir tomorrow early AM - supportive care per primary team   # Acute hypoxic respiratory failure  - secondary to covid 19 infection - supplemental oxygen per primary team  - Has home O2 already  # HTN  - BP stable, meds on hold - can reintroduce as needed as an outpatient  # Anemia  - no acute indication for ESA - follow trends  # Secondary hyperpara  - continue hectorol and binders - needs renal diet    SIRS, per TRH, BCx NGTD.  AF. ABX stopped   Subjective:   No c/o Will be THS at [facility] while [place] for HD tomorrow on new schedule   Objective:   BP 116/72 (BP Location: Right Wrist)   Pulse 68   Temp 97.8 F (36.6 C) (Oral)   Resp 20   Ht 5\' 7"  (1.702 m)   Wt 134.6 kg   SpO2 99%   BMI 46.48 kg/m  No intake or output data in the 24 hours ending 12/10/19 1020 Weight change:   Physical Exam: General: NAD HEENT: NCAT  Eyes: EOMI sclera anicteric Neck:  Supple trachea midline Heart:  RRR Lungs:  CTA b/l Abdomen: soft/obese/NT  Extremities: no pitting edema  Skin: no rash on extremities exposed  Neuro:  Alert and oriented x 3; very pleasant Access LUE AVF with thrill   Imaging: DG CHEST PORT 1 VIEW  Result Date: 12/09/2019 CLINICAL DATA:  COVID-19  pneumonia EXAM: PORTABLE CHEST 1 VIEW COMPARISON:  Two days ago FINDINGS: Cardiomegaly. Improved aeration. There is no edema, consolidation, effusion, or pneumothorax. IMPRESSION: 1. Improved aeration. 2. Cardiomegaly without failure. Electronically Signed   By: Monte Fantasia M.D.   On: 12/09/2019 07:15    Labs: BMET Recent Labs  Lab 12/06/19 2235 12/07/19 0511 12/08/19 0607 12/09/19 0654 12/10/19 0618  NA 128* 130* 130* 134* 130*  K 5.0 4.9 5.5* 4.4 4.6  CL 87* 88* 87* 88* 87*  CO2 21* 18* 16* 19* 18*  GLUCOSE 135* 136* 143* 167* 217*  BUN 98* 102* 120* 84* 99*  CREATININE 16.90* 17.38* 20.09* 14.67* 16.83*  CALCIUM 7.9* 7.8* 7.7* 8.4* 8.0*  PHOS  --   --  11.6* 10.4* 12.0*   CBC Recent Labs  Lab 12/06/19 2235 12/07/19 0511 12/08/19 0607 12/09/19 1253 12/10/19 0618  WBC 3.1* 2.8* 3.0* 5.0 6.5  NEUTROABS 2.2  --  2.2 4.0 5.4  HGB 11.4* 11.6* 11.2* 12.3* 12.6*  HCT 34.5* 34.9* 33.4* 36.5* 38.0*  MCV 96.9 97.8 96.8 96.1 96.7  PLT 241 224 248 319 354    Medications:    . albuterol  2 puff Inhalation Q6H  . allopurinol  100 mg Oral Daily  .  vitamin C  500 mg Oral Daily  . atorvastatin  80 mg Oral q1800  . Chlorhexidine Gluconate Cloth  6 each Topical Q0600  . Chlorhexidine Gluconate Cloth  6 each Topical Q0600  . dexamethasone (DECADRON) injection  6 mg Intravenous Q24H  . doxercalciferol  3.5 mcg Intravenous Q M,W,F-HD  . doxycycline  100 mg Oral Q12H  . heparin injection (subcutaneous)  5,000 Units Subcutaneous Q8H  . insulin aspart  0-15 Units Subcutaneous TID WC  . insulin aspart  0-5 Units Subcutaneous QHS  . insulin aspart  6 Units Subcutaneous TID WC  . mouth rinse  15 mL Mouth Rinse BID  . sevelamer carbonate  800 mg Oral TID WC  . zinc sulfate  220 mg Oral Daily      Rexene Agent , MD 12/10/2019, 10:20 AM

## 2019-12-10 NOTE — TOC Progression Note (Addendum)
Transition of Care [facility]) - Progression Note    Patient Details  Name: KEENEN HICKLING MRN: [id number] Date of Birth: 03/30/1961  Transition of Care [facility]) CM/SW Contact  Thara Searing, Chauncey Reading, RN Phone Number: 12/10/2019, 10:35 AM  Clinical Narrative:   Patient to be discharged early tomorrow in time to go to [facility] in [place] for dialysis at 18 am. Nat Math of [place] notified.   Patient reports he does have oxygen provided by [facility] oxygen in [place]. Call to them to verify, office is closed for the holiday. Patient reports he has a home concentrator and portable oxygen tank. He plans for his uncle to pick him up tomorrow and will bring portable oxygen tank and transport him to dialysis.   Will need to fax Covid test result and DC summary to [facility] at [phone].   No PCP listed, patient reports he follows with [facility] in [place]. Most recent appointment in December was canceled by the [place]. He plans to reschedule follow up appointment next week. [place] emergency notification completed 12/08/19.   ADDENDUM: 1250: Patient confirmed, family will bring portable tank and clothes and be here in the AM to transport him to dialysis in [place].   Expected Discharge Plan: Home/Self Care Barriers to Discharge: Continued Medical Work up  Expected Discharge Plan and Services Expected Discharge Plan: Home/Self Care   Discharge Planning Services: CM Consult, Other - See comment(new OP dialysis center)                          Social Determinants of Health (SDOH) Interventions    Readmission Risk Interventions No flowsheet data found.

## 2019-12-10 NOTE — Progress Notes (Signed)
PROGRESS NOTE  FREDERICK OSCAR [id number] DOB: 01-01-1961 DOA: 12/06/2019 PCP: Patient, No Pcp Per  Brief History:  59 year old male with a history of diabetes mellitus type 2, ESRD, hypertension, morbid obesity presenting with 1 week history of fevers, chills, shortness of breath, coughing, generalized weakness.  The patient missed his hemodialysis session on 12/06/2019 secondary to feeling poorly.  He had denied any nausea, vomiting, diarrhea, abdominal pain, headaches, hemoptysis.  He was exposed to a family member that had a URI.  ED Course: Initial vital signs temperature 100.2 F, pulse 103, respirations 20, blood pressure 89/63 mmHg and O2 sat 88% on room air.  His O2 sat improved to 93% on nasal cannula.  He received Zosyn and vancomycin in the ED.  White count is 3.1, hemoglobin 11.4 g/dL and platelets 241.  D-dimer was 0.7.  Fibrinogen 513.  Triglycerides 193, LDH 244.  Lactic acid was normal at 1.3 mmol/L.  His procalcitonin was 11.61.CRP was 10.1, ferritin 1635.  Sodium 128, potassium 5.0, chloride 87 and CO2 21 mmol/L.  Glucose 135, calcium 7.9, BUN 98 and creatinine 16.9 mg/dL.    Assessment/Plan: Acute on chronic respiratory failure with hypoxia -Secondary to Covid pneumonia -The patient states that he wears 3 L nasal cannula as needed on a regular basis -He is back to 2.5 L presently -CRP 10.1>>> 5.2 -D-dimer 0.70>>> 0.55 -Ferritin 1635>>> 1481 -Continue remdesivir and IV steroids  -Discontinue vancomycin and Zosyn  ESRD -Appreciate nephrology consult -Patient scheduled for dialysis on 12/10/2018 -Plan is to receive last dose of remdesivir in the early morning 12/10/2018 and for discharge if he is stable so he can make it to his dialysis session -Continue Renvela and Hectorol  Diabetes mellitus type 2 -12/07/2019 hemoglobin A1c 7.0 -Continue NovoLog sliding scale -CBGs largely controlled  Essential hypertension -Holding amlodipine and carvedilol  secondary to soft blood pressure  Hyperlipidemia -Continue statin  Hyponatremia -Secondary to ESRD status -Improving after dialysis  Gouty arthritis--chronic -Continue allopurinol     Disposition Plan:   Home on 12/10/18 Family Communication:   Family at bedside  Consultants:  renal  Code Status:  FULL   DVT Prophylaxis:  SCDs   Procedures: As Listed in Progress Note Above  Antibiotics: None      Subjective: Patient denies fevers, chills, headache, chest pain, dyspnea, nausea, vomiting, diarrhea, abdominal pain, dysuria, hematuria, hematochezia, and melena.   Objective: Vitals:   12/08/19 1900 12/08/19 2207 12/09/19 0535 12/09/19 2300  BP: 124/73 139/82 127/71 116/72  Pulse: 77 82 80 68  Resp:  20 20   Temp:  (!) 97.5 F (36.4 C) 98.1 F (36.7 C) 97.8 F (36.6 C)  TempSrc:  Oral Oral Oral  SpO2:  100% 100% 99%  Weight:      Height:       No intake or output data in the 24 hours ending 12/10/19 0857 Weight change:  Exam:   General:  Pt is alert, follows commands appropriately, not in acute distress  HEENT: No icterus, No thrush, No neck mass, [place]/AT  Cardiovascular: RRR, S1/S2, no rubs, no gallops  Respiratory: bibasilar rales. No wheeze  Abdomen: Soft/+BS, non tender, non distended, no guarding  Extremities: No edema, No lymphangitis, No petechiae, No rashes, no synovitis   Data Reviewed: I have personally reviewed following labs and imaging studies Basic Metabolic Panel: Recent Labs  Lab 12/06/19 2235 12/07/19 0511 12/08/19 0607 12/09/19 0654 12/10/19 0618  NA 128* 130* 130*  134* 130*  K 5.0 4.9 5.5* 4.4 4.6  CL 87* 88* 87* 88* 87*  CO2 21* 18* 16* 19* 18*  GLUCOSE 135* 136* 143* 167* 217*  BUN 98* 102* 120* 84* 99*  CREATININE 16.90* 17.38* 20.09* 14.67* 16.83*  CALCIUM 7.9* 7.8* 7.7* 8.4* 8.0*  MG  --   --  2.3 2.4 2.3  PHOS  --   --  11.6* 10.4* 12.0*   Liver Function Tests: Recent Labs  Lab 12/06/19 2235 12/08/19 0607  12/09/19 0654 12/10/19 0618  AST 34 26 21 20   ALT 21 20 20 19   ALKPHOS 56 52 62 77  BILITOT 0.7 0.7 1.0 0.9  PROT 7.8 7.4 7.4 7.8  ALBUMIN 3.5 3.2* 3.3* 3.4*   No results for input(s): LIPASE, AMYLASE in the last 168 hours. No results for input(s): AMMONIA in the last 168 hours. Coagulation Profile: No results for input(s): INR, PROTIME in the last 168 hours. CBC: Recent Labs  Lab 12/06/19 2235 12/07/19 0511 12/08/19 0607 12/09/19 1253 12/10/19 0618  WBC 3.1* 2.8* 3.0* 5.0 6.5  NEUTROABS 2.2  --  2.2 4.0 5.4  HGB 11.4* 11.6* 11.2* 12.3* 12.6*  HCT 34.5* 34.9* 33.4* 36.5* 38.0*  MCV 96.9 97.8 96.8 96.1 96.7  PLT 241 224 248 319 354   Cardiac Enzymes: No results for input(s): CKTOTAL, CKMB, CKMBINDEX, TROPONINI in the last 168 hours. BNP: Invalid input(s): POCBNP CBG: Recent Labs  Lab 12/09/19 0751 12/09/19 1134 12/09/19 1653 12/09/19 2101 12/10/19 0754  GLUCAP 148* 216* 170* 306* 186*   HbA1C: No results for input(s): HGBA1C in the last 72 hours. Urine analysis: No results found for: COLORURINE, APPEARANCEUR, LABSPEC, PHURINE, GLUCOSEU, HGBUR, BILIRUBINUR, KETONESUR, PROTEINUR, UROBILINOGEN, NITRITE, LEUKOCYTESUR Sepsis Labs: @LABRCNTIP (procalcitonin:4,lacticidven:4) ) Recent Results (from the past 240 hour(s))  SARS CORONAVIRUS 2 (Katriona Schmierer 6-24 HRS) Nasopharyngeal Nasopharyngeal Swab     Status: Abnormal   Collection Time: 12/06/19 10:35 PM   Specimen: Nasopharyngeal Swab  Result Value Ref Range Status   SARS Coronavirus 2 POSITIVE (A) NEGATIVE Final    Comment: RESULT CALLED TO, READ BACK BY AND VERIFIED WITH: Jani Gravel RN 13:30 12/07/19 ([email or web address]) (NOTE) SARS-CoV-2 target nucleic acids are DETECTED. The SARS-CoV-2 RNA is generally detectable in upper and lower respiratory specimens during the acute phase of infection. Positive results are indicative of the presence of SARS-CoV-2 RNA. Clinical correlation with patient history and other diagnostic information  is  necessary to determine patient infection status. Positive results do not rule out bacterial infection or co-infection with other viruses.  The expected result is Negative. Fact Sheet for Patients: SugarRoll.be Fact Sheet for Healthcare Providers: https://www.woods-mathews.com/ This test is not yet approved or cleared by the [place] FDA and  has been authorized for detection and/or diagnosis of SARS-CoV-2 by FDA under an Emergency Use Authorization (EUA). This EUA will remain  in effect (meaning this test can be used) for t he duration of the COVID-19 declaration under Section 564(b)(1) of the Act, 21 U.S.C. section 360bbb-3(b)(1), unless the authorization is terminated or revoked sooner. Performed at [facility], [place] [address] [place], [place] [address]   Blood Culture (routine x 2)     Status: None (Preliminary result)   Collection Time: 12/06/19 10:35 PM   Specimen: Right Antecubital; Blood  Result Value Ref Range Status   Specimen Description RIGHT ANTECUBITAL  Final   Special Requests   Final    BOTTLES DRAWN AEROBIC AND ANAEROBIC Blood Culture adequate volume   Culture  Final    NO GROWTH 3 DAYS Performed at [facility] [address], [place], [place] [address]    Report Status PENDING  Incomplete  Blood Culture (routine x 2)     Status: None (Preliminary result)   Collection Time: 12/06/19 11:11 PM   Specimen: BLOOD RIGHT HAND  Result Value Ref Range Status   Specimen Description BLOOD RIGHT HAND  Final   Special Requests   Final    BOTTLES DRAWN AEROBIC AND ANAEROBIC Blood Culture adequate volume   Culture   Final    NO GROWTH 3 DAYS Performed at [facility], [address], [place], [place] [address]    Report Status PENDING  Incomplete  Respiratory Panel by RT PCR (Flu A&B, Covid) - Nasopharyngeal Swab     Status: Abnormal   Collection Time: 12/07/19  4:54 AM   Specimen: Nasopharyngeal Swab  Result  Value Ref Range Status   SARS Coronavirus 2 by RT PCR POSITIVE (A) NEGATIVE Final    Comment: RESULT CALLED TO, READ BACK BY AND VERIFIED WITH: SHORE L. AT [id number] ON [id number] BY THOMPSON S.    Influenza A by PCR NEGATIVE NEGATIVE Final   Influenza B by PCR NEGATIVE NEGATIVE Final    Comment: (NOTE) The Xpert Xpress SARS-CoV-2/FLU/RSV assay is intended as an aid in  the diagnosis of influenza from Nasopharyngeal swab specimens and  should not be used as a sole basis for treatment. Nasal washings and  aspirates are unacceptable for Xpert Xpress SARS-CoV-2/FLU/RSV  testing. Fact Sheet for Patients: PinkCheek.be Fact Sheet for Healthcare Providers: GravelBags.it This test is not yet approved or cleared by the [place] FDA and  has been authorized for detection and/or diagnosis of SARS-CoV-2 by  FDA under an Emergency Use Authorization (EUA). This EUA will remain  in effect (meaning this test can be used) for the duration of the  Covid-19 declaration under Section 564(b)(1) of the Act, 21  U.S.C. section 360bbb-3(b)(1), unless the authorization is  terminated or revoked. Performed at [facility], [address] [place], [place] [address]      Scheduled Meds: . albuterol  2 puff Inhalation Q6H  . allopurinol  100 mg Oral Daily  . vitamin C  500 mg Oral Daily  . atorvastatin  80 mg Oral q1800  . Chlorhexidine Gluconate Cloth  6 each Topical Q0600  . Chlorhexidine Gluconate Cloth  6 each Topical Q0600  . dexamethasone (DECADRON) injection  6 mg Intravenous Q24H  . doxercalciferol  3.5 mcg Intravenous Q M,W,F-HD  . doxycycline  100 mg Oral Q12H  . heparin injection (subcutaneous)  5,000 Units Subcutaneous Q8H  . insulin aspart  0-15 Units Subcutaneous TID WC  . insulin aspart  0-5 Units Subcutaneous QHS  . insulin aspart  6 Units Subcutaneous TID WC  . mouth rinse  15 mL Mouth Rinse BID  . sevelamer carbonate  800 mg Oral  TID WC  . zinc sulfate  220 mg Oral Daily   Continuous Infusions: . sodium chloride    . sodium chloride    . remdesivir 100 mg in NS 100 mL 200 mL/hr at 12/09/19 1300    Procedures/Studies: DG CHEST PORT 1 VIEW  Result Date: 12/09/2019 CLINICAL DATA:  COVID-19 pneumonia EXAM: PORTABLE CHEST 1 VIEW COMPARISON:  Two days ago FINDINGS: Cardiomegaly. Improved aeration. There is no edema, consolidation, effusion, or pneumothorax. IMPRESSION: 1. Improved aeration. 2. Cardiomegaly without failure. Electronically Signed   By: Monte Fantasia M.D.   On:  12/09/2019 07:15   DG CHEST PORT 1 VIEW  Result Date: 12/07/2019 CLINICAL DATA:  COVID positive EXAM: PORTABLE CHEST 1 VIEW COMPARISON:  12/06/2019 FINDINGS: Cardiomegaly. There may be subtle heterogeneous airspace opacity in the bilateral lung bases. Osseous structures are unremarkable. IMPRESSION: 1. There may be subtle heterogeneous airspace opacity in the bilateral lung bases, generally in keeping with COVID-19 airspace disease. This appears new or increased compared to prior radiographs. PA and lateral radiographs or CT may be helpful to further evaluate. 2.  Cardiomegaly. Electronically Signed   By: Eddie Candle M.D.   On: 12/07/2019 11:20   DG Chest Port 1 View  Result Date: 12/06/2019 CLINICAL DATA:  Shortness of breath EXAM: PORTABLE CHEST 1 VIEW COMPARISON:  04/23/2013 FINDINGS: Cardiomegaly with mild central congestion. No pleural effusion or overt pulmonary edema. No pneumothorax. IMPRESSION: Cardiomegaly with mild central congestion. No definite focal airspace disease. Electronically Signed   By: Donavan Foil M.D.   On: 12/06/2019 22:39    Orson Eva, DO  [facility] Pager [phone]  If 7PM-7AM, please contact night-coverage [email or web address] Password [email or web address] 12/10/2019, 8:58 AM   LOS: 3 days

## 2019-12-11 DIAGNOSIS — N186 End stage renal disease: Secondary | ICD-10-CM

## 2019-12-11 LAB — COMPREHENSIVE METABOLIC PANEL
ALT: 16 U/L (ref 0–44)
AST: 18 U/L (ref 15–41)
Albumin: 3.3 g/dL — ABNORMAL LOW (ref 3.5–5.0)
Alkaline Phosphatase: 91 U/L (ref 38–126)
Anion gap: >20 — ABNORMAL HIGH (ref 5–15)
BUN: 109 mg/dL — ABNORMAL HIGH (ref 6–20)
CO2: 16 mmol/L — ABNORMAL LOW (ref 22–32)
Calcium: 7.8 mg/dL — ABNORMAL LOW (ref 8.9–10.3)
Chloride: 87 mmol/L — ABNORMAL LOW (ref 98–111)
Creatinine, Ser: 18.55 mg/dL — ABNORMAL HIGH (ref 0.61–1.24)
GFR calc Af Amer: 3 mL/min — ABNORMAL LOW (ref >60)
GFR calc non Af Amer: 2 mL/min — ABNORMAL LOW (ref >60)
Glucose, Bld: 168 mg/dL — ABNORMAL HIGH (ref 70–99)
Potassium: 5.2 mmol/L — ABNORMAL HIGH (ref 3.5–5.1)
Sodium: 131 mmol/L — ABNORMAL LOW (ref 135–145)
Total Bilirubin: 0.4 mg/dL (ref 0.3–1.2)
Total Protein: 7.4 g/dL (ref 6.5–8.1)

## 2019-12-11 LAB — PHOSPHORUS: Phosphorus: 11.2 mg/dL — ABNORMAL HIGH (ref 2.5–4.6)

## 2019-12-11 LAB — CBC WITH DIFFERENTIAL/PLATELET
Abs Immature Granulocytes: 0.16 10*3/uL — ABNORMAL HIGH (ref 0.00–0.07)
Basophils Absolute: 0 10*3/uL (ref 0.0–0.1)
Basophils Relative: 0 %
Eosinophils Absolute: 0 10*3/uL (ref 0.0–0.5)
Eosinophils Relative: 0 %
HCT: 38.4 % — ABNORMAL LOW (ref 39.0–52.0)
Hemoglobin: 12.8 g/dL — ABNORMAL LOW (ref 13.0–17.0)
Immature Granulocytes: 2 %
Lymphocytes Relative: 7 %
Lymphs Abs: 0.6 10*3/uL — ABNORMAL LOW (ref 0.7–4.0)
MCH: 32.2 pg (ref 26.0–34.0)
MCHC: 33.3 g/dL (ref 30.0–36.0)
MCV: 96.5 fL (ref 80.0–100.0)
Monocytes Absolute: 0.8 10*3/uL (ref 0.1–1.0)
Monocytes Relative: 9 %
Neutro Abs: 7.5 10*3/uL (ref 1.7–7.7)
Neutrophils Relative %: 82 %
Platelets: 417 10*3/uL — ABNORMAL HIGH (ref 150–400)
RBC: 3.98 MIL/uL — ABNORMAL LOW (ref 4.22–5.81)
RDW: 15 % (ref 11.5–15.5)
WBC: 9.1 10*3/uL (ref 4.0–10.5)
nRBC: 0.2 % (ref 0.0–0.2)

## 2019-12-11 LAB — CULTURE, BLOOD (ROUTINE X 2)
Culture: NO GROWTH
Culture: NO GROWTH
Special Requests: ADEQUATE
Special Requests: ADEQUATE

## 2019-12-11 LAB — C-REACTIVE PROTEIN: CRP: 3.6 mg/dL — ABNORMAL HIGH (ref <1.0)

## 2019-12-11 LAB — D-DIMER, QUANTITATIVE: D-Dimer, Quant: 0.5 ug/mL-FEU (ref 0.00–0.50)

## 2019-12-11 LAB — MAGNESIUM: Magnesium: 2.2 mg/dL (ref 1.7–2.4)

## 2019-12-11 LAB — GLUCOSE, CAPILLARY: Glucose-Capillary: 173 mg/dL — ABNORMAL HIGH (ref 70–99)

## 2019-12-11 LAB — FERRITIN: Ferritin: 1238 ng/mL — ABNORMAL HIGH (ref 24–336)

## 2019-12-11 MED ORDER — ZINC SULFATE 220 (50 ZN) MG PO CAPS: 220.0000 mg | ORAL_CAPSULE | Freq: Every day | ORAL | Status: AC

## 2019-12-11 MED ORDER — DEXAMETHASONE 4 MG PO TABS
6.0000 mg | ORAL_TABLET | Freq: Every day | ORAL | Status: DC
  Administered 2019-12-11: 08:00:00 6 mg via ORAL
  Filled 2019-12-11: qty 2

## 2019-12-11 MED ORDER — DEXAMETHASONE 6 MG PO TABS: 6.0000 mg | ORAL_TABLET | Freq: Every day | ORAL | 0 refills | Status: DC

## 2019-12-11 MED ORDER — ASCORBIC ACID 500 MG PO TABS: 500.0000 mg | ORAL_TABLET | Freq: Every day | ORAL | Status: AC

## 2019-12-11 NOTE — Discharge Summary (Addendum)
Physician Discharge Summary  Jesus Williams [id number] DOB: 1961/11/29 DOA: 12/06/2019  PCP: Patient, No Pcp Per  Admit date: 12/06/2019 Discharge date: 12/11/2019  Admitted From: Home Disposition:  Home   Recommendations for Outpatient Follow-up:  1. Follow up with PCP in 1-2 weeks 2. Please obtain BMP/CBC in one week   Home Health: oxygen Equipment/Devices: 2.5L--pt had this prior to admission  Discharge Condition: Stable CODE STATUS: FULL Diet recommendation: Heart Healthy / Carb Modified    Brief/Interim Summary: 59 year old male with a history of diabetes mellitus type 2, ESRD, hypertension, morbid obesity presenting with 1 week history of fevers, chills, shortness of breath, coughing, generalized weakness.  The patient missed his hemodialysis session on 12/06/2019 secondary to feeling poorly.  He had denied any nausea, vomiting, diarrhea, abdominal pain, headaches, hemoptysis.  He was exposed to a family member that had a URI.  ED Course:Initial vital signs temperature 100.2 F, pulse 103, respirations 20, blood pressure 89/63 mmHg and O2 sat 88% on room air. His O2 sat improved to 93% on nasal cannula. He received Zosyn and vancomycin in the ED.  White count is 3.1, hemoglobin 11.4 g/dL and platelets 241. D-dimer was 0.7. Fibrinogen 513. Triglycerides 193, LDH 244. Lactic acid was normal at 1.3 mmol/L. His procalcitonin was 11.61.CRP was 10.1, ferritin 1635. Sodium 128, potassium 5.0, chloride 87 and CO2 21 mmol/L. Glucose 135, calcium 7.9, BUN 98 and creatinine 16.9 mg/dL.  He was started on IV dexamethasone and remdesivir.  The patient improved clinically.  He was weaned down to his baseline dose of home oxygen at 2.5 L.  His intravenous antibiotics, Zosyn and vancomycin were discontinued, and the patient was observed clinically.  He remained clinically stable, afebrile, hemodynamically stable.  His inflammatory markers gradually improved. The patient  informed me that he is only using his home oxygen as needed during the daytime.  This was verified by case management.  On the day of discharge, the patient was ambulated on room air and did not desaturate less than 88%.  However, the patient continued to use oxygen for comfort.  Nevertheless, the patient had home oxygen already set up prior to his admission here to the hospital.  The patient will be discharged with 5 additional days of dexamethasone.  He completed a 5-day course of remdesivir.  The patient is to be picked up by his nephew to go to dialysis at his new dialysis center on the day of discharge, 12/11/2019.  Discharge Diagnoses:  Acute on chronic respiratory failure with hypoxia -Secondary to Covid pneumonia -The patient states that he wears 3 L nasal cannula as needed on a regular basis -He is back to 2.5 L presently  -CRP 10.1>>> 5.2 -D-dimer 0.70>>> 0.55 -Ferritin 1635>>> 1481 -Continue remdesivir and IV steroids--finished 5 days -Discontinued vancomycin and Zosyn--remained stable -d/c home with 5 more days dexamethasone  ESRD -Appreciate nephrology consult -Patient scheduled for dialysis on 12/10/2018 -Plan receive last dose of remdesivir in the early morning 12/10/2018 and for discharge -The patient has been set up at his new dialysis center. -His nephew is to drive him to his first dialysis session at 11 AM on 12/11/2019. -Continue Renvela and Hectorol  Diabetes mellitus type 2 -12/07/2019 hemoglobin A1c 7.0 -Continue NovoLog sliding scale -CBGs largely controlled  Essential hypertension -Holding amlodipine and carvedilol secondary to soft blood pressure during the hospitalization -restart coreg at time of d/c -continue to hold amlodipine and follow up with PCP for BP check  Hyperlipidemia -Continue statin  Hyponatremia -  Secondary to ESRD status -Improving after dialysis  Gouty arthritis--chronic -Continue allopurinol    Discharge  Instructions  Discharge Instructions    MyChart COVID-19 home monitoring program   Complete by: Dec 11, 2019    Is the patient willing to use the [facility] for home monitoring?: Yes   Temperature monitoring   Complete by: Dec 11, 2019    After how many days would you like to receive a notification of this patient's flowsheet entries?: 1     Allergies as of 12/11/2019   No Known Allergies     Medication List    STOP taking these medications   amLODipine 10 MG tablet Commonly known as: NORVASC     TAKE these medications   albuterol 108 (90 Base) MCG/ACT inhaler Commonly known as: VENTOLIN HFA Inhale 2 puffs into the lungs every 6 (six) hours as needed for wheezing or shortness of breath.   allopurinol 100 MG tablet Commonly known as: ZYLOPRIM Take 100 mg by mouth daily.   ascorbic acid 500 MG tablet Commonly known as: VITAMIN C Take 1 tablet (500 mg total) by mouth daily. X 5 days   aspirin 81 MG chewable tablet Chew 1 tablet by mouth daily.   atorvastatin 80 MG tablet Commonly known as: LIPITOR Take 1 tablet by mouth daily.   carvedilol 25 MG tablet Commonly known as: COREG Take 25 mg by mouth 2 (two) times daily with a meal.   dexamethasone 6 MG tablet Commonly known as: DECADRON Take 1 tablet (6 mg total) by mouth daily. Start taking on: December 12, 2019   NovoLOG Mix 70/30 (70-30) 100 UNIT/ML injection Generic drug: insulin aspart protamine- aspart Inject 42 Units into the skin 2 (two) times daily.   sevelamer carbonate 800 MG tablet Commonly known as: RENVELA Take 800 mg by mouth 3 (three) times daily with meals.   zinc sulfate 220 (50 Zn) MG capsule Take 1 capsule (220 mg total) by mouth daily. X 5 days      Follow-up Information    [facility].   Contact information: [place] [address] [phone]          No Known Allergies  Consultations:  renal   Procedures/Studies: DG CHEST  PORT 1 VIEW  Result Date: 12/09/2019 CLINICAL DATA:  COVID-19 pneumonia EXAM: PORTABLE CHEST 1 VIEW COMPARISON:  Two days ago FINDINGS: Cardiomegaly. Improved aeration. There is no edema, consolidation, effusion, or pneumothorax. IMPRESSION: 1. Improved aeration. 2. Cardiomegaly without failure. Electronically Signed   By: Monte Fantasia M.D.   On: 12/09/2019 07:15   DG CHEST PORT 1 VIEW  Result Date: 12/07/2019 CLINICAL DATA:  COVID positive EXAM: PORTABLE CHEST 1 VIEW COMPARISON:  12/06/2019 FINDINGS: Cardiomegaly. There may be subtle heterogeneous airspace opacity in the bilateral lung bases. Osseous structures are unremarkable. IMPRESSION: 1. There may be subtle heterogeneous airspace opacity in the bilateral lung bases, generally in keeping with COVID-19 airspace disease. This appears new or increased compared to prior radiographs. PA and lateral radiographs or CT may be helpful to further evaluate. 2.  Cardiomegaly. Electronically Signed   By: Eddie Candle M.D.   On: 12/07/2019 11:20   DG Chest Port 1 View  Result Date: 12/06/2019 CLINICAL DATA:  Shortness of breath EXAM: PORTABLE CHEST 1 VIEW COMPARISON:  04/23/2013 FINDINGS: Cardiomegaly with mild central congestion. No pleural effusion or overt pulmonary edema. No pneumothorax. IMPRESSION: Cardiomegaly with mild central congestion. No definite focal airspace disease.  Electronically Signed   By: Donavan Foil M.D.   On: 12/06/2019 22:39         Discharge Exam: Vitals:   12/10/19 2145 12/11/19 0555  BP: (!) 143/80 (!) 146/89  Pulse: 70 90  Resp: 16 18  Temp: 97.6 F (36.4 C) 97.7 F (36.5 C)  SpO2: 100% 99%   Vitals:   12/10/19 1451 12/10/19 2017 12/10/19 2145 12/11/19 0555  BP: 113/76  (!) 143/80 (!) 146/89  Pulse: 88  70 90  Resp: 12  16 18   Temp: 98 F (36.7 C)  97.6 F (36.4 C) 97.7 F (36.5 C)  TempSrc: Oral  Oral Oral  SpO2: 100% 97% 100% 99%  Weight:    134.2 kg  Height:        General: Pt is alert,  awake, not in acute distress Cardiovascular: RRR, S1/S2 +, no rubs, no gallops Respiratory: bibasilar rales. No wheeze Abdominal: Soft, NT, ND, bowel sounds + Extremities: no edema, no cyanosis   The results of significant diagnostics from this hospitalization (including imaging, microbiology, ancillary and laboratory) are listed below for reference.    Significant Diagnostic Studies: DG CHEST PORT 1 VIEW  Result Date: 12/09/2019 CLINICAL DATA:  COVID-19 pneumonia EXAM: PORTABLE CHEST 1 VIEW COMPARISON:  Two days ago FINDINGS: Cardiomegaly. Improved aeration. There is no edema, consolidation, effusion, or pneumothorax. IMPRESSION: 1. Improved aeration. 2. Cardiomegaly without failure. Electronically Signed   By: Monte Fantasia M.D.   On: 12/09/2019 07:15   DG CHEST PORT 1 VIEW  Result Date: 12/07/2019 CLINICAL DATA:  COVID positive EXAM: PORTABLE CHEST 1 VIEW COMPARISON:  12/06/2019 FINDINGS: Cardiomegaly. There may be subtle heterogeneous airspace opacity in the bilateral lung bases. Osseous structures are unremarkable. IMPRESSION: 1. There may be subtle heterogeneous airspace opacity in the bilateral lung bases, generally in keeping with COVID-19 airspace disease. This appears new or increased compared to prior radiographs. PA and lateral radiographs or CT may be helpful to further evaluate. 2.  Cardiomegaly. Electronically Signed   By: Eddie Candle M.D.   On: 12/07/2019 11:20   DG Chest Port 1 View  Result Date: 12/06/2019 CLINICAL DATA:  Shortness of breath EXAM: PORTABLE CHEST 1 VIEW COMPARISON:  04/23/2013 FINDINGS: Cardiomegaly with mild central congestion. No pleural effusion or overt pulmonary edema. No pneumothorax. IMPRESSION: Cardiomegaly with mild central congestion. No definite focal airspace disease. Electronically Signed   By: Donavan Foil M.D.   On: 12/06/2019 22:39     Microbiology: Recent Results (from the past 240 hour(s))  SARS CORONAVIRUS 2 (Odessie Polzin 6-24 HRS)  Nasopharyngeal Nasopharyngeal Swab     Status: Abnormal   Collection Time: 12/06/19 10:35 PM   Specimen: Nasopharyngeal Swab  Result Value Ref Range Status   SARS Coronavirus 2 POSITIVE (A) NEGATIVE Final    Comment: RESULT CALLED TO, READ BACK BY AND VERIFIED WITH: Jani Gravel RN 13:30 12/07/19 ([email or web address]) (NOTE) SARS-CoV-2 target nucleic acids are DETECTED. The SARS-CoV-2 RNA is generally detectable in upper and lower respiratory specimens during the acute phase of infection. Positive results are indicative of the presence of SARS-CoV-2 RNA. Clinical correlation with patient history and other diagnostic information is  necessary to determine patient infection status. Positive results do not rule out bacterial infection or co-infection with other viruses.  The expected result is Negative. Fact Sheet for Patients: SugarRoll.be Fact Sheet for Healthcare Providers: https://www.woods-mathews.com/ This test is not yet approved or cleared by the [place] FDA and  has been authorized for detection and/or diagnosis  of SARS-CoV-2 by FDA under an Emergency Use Authorization (EUA). This EUA will remain  in effect (meaning this test can be used) for t he duration of the COVID-19 declaration under Section 564(b)(1) of the Act, 21 U.S.C. section 360bbb-3(b)(1), unless the authorization is terminated or revoked sooner. Performed at [facility], [place] [address], [place], [place] [address]   Blood Culture (routine x 2)     Status: None   Collection Time: 12/06/19 10:35 PM   Specimen: Right Antecubital; Blood  Result Value Ref Range Status   Specimen Description RIGHT ANTECUBITAL  Final   Special Requests   Final    BOTTLES DRAWN AEROBIC AND ANAEROBIC Blood Culture adequate volume   Culture   Final    NO GROWTH 5 DAYS Performed at [facility], [address], [place], [place] [address]    Report Status 12/11/2019 FINAL  Final  Blood Culture  (routine x 2)     Status: None   Collection Time: 12/06/19 11:11 PM   Specimen: BLOOD RIGHT HAND  Result Value Ref Range Status   Specimen Description BLOOD RIGHT HAND  Final   Special Requests   Final    BOTTLES DRAWN AEROBIC AND ANAEROBIC Blood Culture adequate volume   Culture   Final    NO GROWTH 5 DAYS Performed at [facility], [address] [place], [place] [address]    Report Status 12/11/2019 FINAL  Final  Respiratory Panel by RT PCR (Flu A&B, Covid) - Nasopharyngeal Swab     Status: Abnormal   Collection Time: 12/07/19  4:54 AM   Specimen: Nasopharyngeal Swab  Result Value Ref Range Status   SARS Coronavirus 2 by RT PCR POSITIVE (A) NEGATIVE Final    Comment: RESULT CALLED TO, READ BACK BY AND VERIFIED WITH: SHORE L. AT [id number] ON [id number] BY THOMPSON S.    Influenza A by PCR NEGATIVE NEGATIVE Final   Influenza B by PCR NEGATIVE NEGATIVE Final    Comment: (NOTE) The Xpert Xpress SARS-CoV-2/FLU/RSV assay is intended as an aid in  the diagnosis of influenza from Nasopharyngeal swab specimens and  should not be used as a sole basis for treatment. Nasal washings and  aspirates are unacceptable for Xpert Xpress SARS-CoV-2/FLU/RSV  testing. Fact Sheet for Patients: PinkCheek.be Fact Sheet for Healthcare Providers: GravelBags.it This test is not yet approved or cleared by the [place] FDA and  has been authorized for detection and/or diagnosis of SARS-CoV-2 by  FDA under an Emergency Use Authorization (EUA). This EUA will remain  in effect (meaning this test can be used) for the duration of the  Covid-19 declaration under Section 564(b)(1) of the Act, 21  U.S.C. section 360bbb-3(b)(1), unless the authorization is  terminated or revoked. Performed at [facility], [address], [place], [place] [address]      Labs: Basic Metabolic Panel: Recent Labs  Lab 12/06/19 2235 12/07/19 0511 12/08/19 0607  12/09/19 0654 12/10/19 0618  NA 128* 130* 130* 134* 130*  K 5.0 4.9 5.5* 4.4 4.6  CL 87* 88* 87* 88* 87*  CO2 21* 18* 16* 19* 18*  GLUCOSE 135* 136* 143* 167* 217*  BUN 98* 102* 120* 84* 99*  CREATININE 16.90* 17.38* 20.09* 14.67* 16.83*  CALCIUM 7.9* 7.8* 7.7* 8.4* 8.0*  MG  --   --  2.3 2.4 2.3  PHOS  --   --  11.6* 10.4* 12.0*   Liver Function Tests: Recent Labs  Lab 12/06/19 2235 12/08/19 0607 12/09/19 0654 12/10/19  0618  AST 34 26 21 20   ALT 21 20 20 19   ALKPHOS 56 52 62 77  BILITOT 0.7 0.7 1.0 0.9  PROT 7.8 7.4 7.4 7.8  ALBUMIN 3.5 3.2* 3.3* 3.4*   No results for input(s): LIPASE, AMYLASE in the last 168 hours. No results for input(s): AMMONIA in the last 168 hours. CBC: Recent Labs  Lab 12/06/19 2235 12/07/19 0511 12/08/19 0607 12/09/19 1253 12/10/19 0618  WBC 3.1* 2.8* 3.0* 5.0 6.5  NEUTROABS 2.2  --  2.2 4.0 5.4  HGB 11.4* 11.6* 11.2* 12.3* 12.6*  HCT 34.5* 34.9* 33.4* 36.5* 38.0*  MCV 96.9 97.8 96.8 96.1 96.7  PLT 241 224 248 319 354   Cardiac Enzymes: No results for input(s): CKTOTAL, CKMB, CKMBINDEX, TROPONINI in the last 168 hours. BNP: Invalid input(s): POCBNP CBG: Recent Labs  Lab 12/10/19 0754 12/10/19 1157 12/10/19 1656 12/10/19 2147 12/11/19 0720  GLUCAP 186* 239* 229* 316* 173*    Time coordinating discharge:  36 minutes  Signed:  Orson Eva, DO [facility] Pager: [phone] 12/11/2019, 8:02 AM

## 2019-12-11 NOTE — Progress Notes (Signed)
Pt provided with d/c instructions, follow up care, and medication changes. Advised pt about COVID 19 dx and need to wear mask, social isolate, and let known contacts be aware of positive result. Family brought oxygen tank for pt to be d/c with. Pt made aware to go to dialysis in [place] today prior to 1100. States understand, denies further question. Assisted by tech to main entrance via WC.

## 2019-12-27 ENCOUNTER — Inpatient Hospital Stay (HOSPITAL_COMMUNITY)
Admission: EM | Admit: 2019-12-27 | Discharge: 2019-12-29 | DRG: 286 | Disposition: A | Discharge status: Home / Self Care | Payer: No Typology Code available for payment source | Attending: Internal Medicine | Admitting: Internal Medicine

## 2019-12-27 ENCOUNTER — Emergency Department (HOSPITAL_COMMUNITY): Payer: No Typology Code available for payment source

## 2019-12-27 ENCOUNTER — Other Ambulatory Visit: Payer: Self-pay

## 2019-12-27 ENCOUNTER — Inpatient Hospital Stay (HOSPITAL_COMMUNITY): Payer: No Typology Code available for payment source

## 2019-12-27 ENCOUNTER — Other Ambulatory Visit (HOSPITAL_COMMUNITY): Payer: Self-pay | Admitting: *Deleted

## 2019-12-27 ENCOUNTER — Encounter (HOSPITAL_COMMUNITY): Payer: Self-pay | Admitting: Emergency Medicine

## 2019-12-27 DIAGNOSIS — R0789 Other chest pain: Secondary | ICD-10-CM | POA: Diagnosis not present

## 2019-12-27 DIAGNOSIS — B948 Sequelae of other specified infectious and parasitic diseases: Secondary | ICD-10-CM | POA: Diagnosis not present

## 2019-12-27 DIAGNOSIS — I214 Non-ST elevation (NSTEMI) myocardial infarction: Secondary | ICD-10-CM | POA: Diagnosis present

## 2019-12-27 DIAGNOSIS — J1282 Pneumonia due to coronavirus disease 2019: Secondary | ICD-10-CM | POA: Diagnosis not present

## 2019-12-27 DIAGNOSIS — Z8249 Family history of ischemic heart disease and other diseases of the circulatory system: Secondary | ICD-10-CM | POA: Diagnosis not present

## 2019-12-27 DIAGNOSIS — D631 Anemia in chronic kidney disease: Secondary | ICD-10-CM | POA: Diagnosis present

## 2019-12-27 DIAGNOSIS — R9431 Abnormal electrocardiogram [ECG] [EKG]: Secondary | ICD-10-CM

## 2019-12-27 DIAGNOSIS — I400 Infective myocarditis: Principal | ICD-10-CM | POA: Diagnosis present

## 2019-12-27 DIAGNOSIS — E1122 Type 2 diabetes mellitus with diabetic chronic kidney disease: Secondary | ICD-10-CM | POA: Diagnosis present

## 2019-12-27 DIAGNOSIS — E875 Hyperkalemia: Secondary | ICD-10-CM | POA: Diagnosis present

## 2019-12-27 DIAGNOSIS — M109 Gout, unspecified: Secondary | ICD-10-CM | POA: Diagnosis present

## 2019-12-27 DIAGNOSIS — E782 Mixed hyperlipidemia: Secondary | ICD-10-CM | POA: Diagnosis present

## 2019-12-27 DIAGNOSIS — Z6841 Body Mass Index (BMI) 40.0 and over, adult: Secondary | ICD-10-CM | POA: Diagnosis not present

## 2019-12-27 DIAGNOSIS — R778 Other specified abnormalities of plasma proteins: Secondary | ICD-10-CM

## 2019-12-27 DIAGNOSIS — E1165 Type 2 diabetes mellitus with hyperglycemia: Secondary | ICD-10-CM | POA: Diagnosis present

## 2019-12-27 DIAGNOSIS — J449 Chronic obstructive pulmonary disease, unspecified: Secondary | ICD-10-CM | POA: Diagnosis present

## 2019-12-27 DIAGNOSIS — Z992 Dependence on renal dialysis: Secondary | ICD-10-CM | POA: Diagnosis not present

## 2019-12-27 DIAGNOSIS — R0602 Shortness of breath: Secondary | ICD-10-CM | POA: Diagnosis present

## 2019-12-27 DIAGNOSIS — R079 Chest pain, unspecified: Secondary | ICD-10-CM

## 2019-12-27 DIAGNOSIS — R0609 Other forms of dyspnea: Secondary | ICD-10-CM

## 2019-12-27 DIAGNOSIS — R7989 Other specified abnormal findings of blood chemistry: Secondary | ICD-10-CM

## 2019-12-27 DIAGNOSIS — G4733 Obstructive sleep apnea (adult) (pediatric): Secondary | ICD-10-CM | POA: Diagnosis present

## 2019-12-27 DIAGNOSIS — I10 Essential (primary) hypertension: Secondary | ICD-10-CM | POA: Diagnosis not present

## 2019-12-27 DIAGNOSIS — Z7982 Long term (current) use of aspirin: Secondary | ICD-10-CM

## 2019-12-27 DIAGNOSIS — E119 Type 2 diabetes mellitus without complications: Secondary | ICD-10-CM | POA: Diagnosis not present

## 2019-12-27 DIAGNOSIS — Z794 Long term (current) use of insulin: Secondary | ICD-10-CM | POA: Diagnosis not present

## 2019-12-27 DIAGNOSIS — J9611 Chronic respiratory failure with hypoxia: Secondary | ICD-10-CM | POA: Diagnosis present

## 2019-12-27 DIAGNOSIS — U071 COVID-19: Secondary | ICD-10-CM | POA: Diagnosis present

## 2019-12-27 DIAGNOSIS — N186 End stage renal disease: Secondary | ICD-10-CM | POA: Diagnosis present

## 2019-12-27 DIAGNOSIS — R0600 Dyspnea, unspecified: Secondary | ICD-10-CM | POA: Diagnosis present

## 2019-12-27 DIAGNOSIS — I120 Hypertensive chronic kidney disease with stage 5 chronic kidney disease or end stage renal disease: Secondary | ICD-10-CM | POA: Diagnosis present

## 2019-12-27 DIAGNOSIS — N2581 Secondary hyperparathyroidism of renal origin: Secondary | ICD-10-CM | POA: Diagnosis present

## 2019-12-27 DIAGNOSIS — M1A9XX0 Chronic gout, unspecified, without tophus (tophi): Secondary | ICD-10-CM | POA: Diagnosis present

## 2019-12-27 DIAGNOSIS — I4510 Unspecified right bundle-branch block: Secondary | ICD-10-CM | POA: Diagnosis present

## 2019-12-27 HISTORY — DX: Type 2 diabetes mellitus without complications: E11.9

## 2019-12-27 HISTORY — DX: Chronic respiratory failure, unspecified whether with hypoxia or hypercapnia: J96.10

## 2019-12-27 HISTORY — DX: Anemia, unspecified: D64.9

## 2019-12-27 HISTORY — DX: COVID-19: U07.1

## 2019-12-27 HISTORY — DX: Pneumonia due to coronavirus disease 2019: J12.82

## 2019-12-27 HISTORY — DX: End stage renal disease: N18.6

## 2019-12-27 HISTORY — DX: Morbid (severe) obesity due to excess calories: E66.01

## 2019-12-27 LAB — CBC
HCT: 35.6 % — ABNORMAL LOW (ref 39.0–52.0)
Hemoglobin: 11.2 g/dL — ABNORMAL LOW (ref 13.0–17.0)
MCH: 32.1 pg (ref 26.0–34.0)
MCHC: 31.5 g/dL (ref 30.0–36.0)
MCV: 102 fL — ABNORMAL HIGH (ref 80.0–100.0)
Platelets: 188 10*3/uL (ref 150–400)
RBC: 3.49 MIL/uL — ABNORMAL LOW (ref 4.22–5.81)
RDW: 16.7 % — ABNORMAL HIGH (ref 11.5–15.5)
WBC: 8.5 10*3/uL (ref 4.0–10.5)
nRBC: 0 % (ref 0.0–0.2)

## 2019-12-27 LAB — COMPREHENSIVE METABOLIC PANEL
ALT: 28 U/L (ref 0–44)
AST: 21 U/L (ref 15–41)
Albumin: 3.2 g/dL — ABNORMAL LOW (ref 3.5–5.0)
Alkaline Phosphatase: 118 U/L (ref 38–126)
Anion gap: 17 — ABNORMAL HIGH (ref 5–15)
BUN: 58 mg/dL — ABNORMAL HIGH (ref 6–20)
CO2: 21 mmol/L — ABNORMAL LOW (ref 22–32)
Calcium: 8.6 mg/dL — ABNORMAL LOW (ref 8.9–10.3)
Chloride: 96 mmol/L — ABNORMAL LOW (ref 98–111)
Creatinine, Ser: 11.54 mg/dL — ABNORMAL HIGH (ref 0.61–1.24)
GFR calc Af Amer: 5 mL/min — ABNORMAL LOW (ref >60)
GFR calc non Af Amer: 4 mL/min — ABNORMAL LOW (ref >60)
Glucose, Bld: 327 mg/dL — ABNORMAL HIGH (ref 70–99)
Potassium: 6.5 mmol/L (ref 3.5–5.1)
Sodium: 134 mmol/L — ABNORMAL LOW (ref 135–145)
Total Bilirubin: 0.9 mg/dL (ref 0.3–1.2)
Total Protein: 7.3 g/dL (ref 6.5–8.1)

## 2019-12-27 LAB — GLUCOSE, CAPILLARY
Glucose-Capillary: 71 mg/dL (ref 70–99)
Glucose-Capillary: 111 mg/dL — ABNORMAL HIGH (ref 70–99)
Glucose-Capillary: 120 mg/dL — ABNORMAL HIGH (ref 70–99)

## 2019-12-27 LAB — RENAL FUNCTION PANEL
Albumin: 3.2 g/dL — ABNORMAL LOW (ref 3.5–5.0)
Anion gap: 15 (ref 5–15)
BUN: 59 mg/dL — ABNORMAL HIGH (ref 6–20)
CO2: 23 mmol/L (ref 22–32)
Calcium: 8.9 mg/dL (ref 8.9–10.3)
Chloride: 100 mmol/L (ref 98–111)
Creatinine, Ser: 12.14 mg/dL — ABNORMAL HIGH (ref 0.61–1.24)
GFR calc Af Amer: 5 mL/min — ABNORMAL LOW (ref >60)
GFR calc non Af Amer: 4 mL/min — ABNORMAL LOW (ref >60)
Glucose, Bld: 96 mg/dL (ref 70–99)
Phosphorus: 5.9 mg/dL — ABNORMAL HIGH (ref 2.5–4.6)
Potassium: 5.4 mmol/L — ABNORMAL HIGH (ref 3.5–5.1)
Sodium: 138 mmol/L (ref 135–145)

## 2019-12-27 LAB — CBG MONITORING, ED
Glucose-Capillary: 178 mg/dL — ABNORMAL HIGH (ref 70–99)
Glucose-Capillary: 151 mg/dL — ABNORMAL HIGH (ref 70–99)

## 2019-12-27 LAB — D-DIMER, QUANTITATIVE: D-Dimer, Quant: 0.62 ug/mL-FEU — ABNORMAL HIGH (ref 0.00–0.50)

## 2019-12-27 LAB — CBC WITH DIFFERENTIAL/PLATELET
Abs Immature Granulocytes: 0.03 10*3/uL (ref 0.00–0.07)
Basophils Absolute: 0 10*3/uL (ref 0.0–0.1)
Basophils Relative: 0 %
Eosinophils Absolute: 0.3 10*3/uL (ref 0.0–0.5)
Eosinophils Relative: 4 %
HCT: 37.2 % — ABNORMAL LOW (ref 39.0–52.0)
Hemoglobin: 11.5 g/dL — ABNORMAL LOW (ref 13.0–17.0)
Immature Granulocytes: 0 %
Lymphocytes Relative: 11 %
Lymphs Abs: 0.9 10*3/uL (ref 0.7–4.0)
MCH: 32.1 pg (ref 26.0–34.0)
MCHC: 30.9 g/dL (ref 30.0–36.0)
MCV: 103.9 fL — ABNORMAL HIGH (ref 80.0–100.0)
Monocytes Absolute: 1.1 10*3/uL — ABNORMAL HIGH (ref 0.1–1.0)
Monocytes Relative: 14 %
Neutro Abs: 5.4 10*3/uL (ref 1.7–7.7)
Neutrophils Relative %: 71 %
Platelets: 178 10*3/uL (ref 150–400)
RBC: 3.58 MIL/uL — ABNORMAL LOW (ref 4.22–5.81)
RDW: 16.8 % — ABNORMAL HIGH (ref 11.5–15.5)
WBC: 7.7 10*3/uL (ref 4.0–10.5)
nRBC: 0.3 % — ABNORMAL HIGH (ref 0.0–0.2)

## 2019-12-27 LAB — TROPONIN I (HIGH SENSITIVITY)
Troponin I (High Sensitivity): 149 ng/L (ref <18)
Troponin I (High Sensitivity): 1009 ng/L (ref <18)
Troponin I (High Sensitivity): 2648 ng/L (ref <18)
Troponin I (High Sensitivity): 3483 ng/L (ref <18)
Troponin I (High Sensitivity): 4663 ng/L (ref <18)

## 2019-12-27 LAB — ECHOCARDIOGRAM LIMITED
Height: 67 in
Weight: 4752 oz

## 2019-12-27 LAB — LACTATE DEHYDROGENASE: LDH: 193 U/L — ABNORMAL HIGH (ref 98–192)

## 2019-12-27 LAB — HEPARIN LEVEL (UNFRACTIONATED): Heparin Unfractionated: 0.29 IU/mL — ABNORMAL LOW (ref 0.30–0.70)

## 2019-12-27 MED ORDER — TECHNETIUM TO 99M ALBUMIN AGGREGATED
1.5000 | Freq: Once | INTRAVENOUS | Status: AC | PRN
  Administered 2019-12-27: 1.7 via INTRAVENOUS

## 2019-12-27 MED ORDER — LORATADINE 10 MG PO TABS
5.0000 mg | ORAL_TABLET | Freq: Every day | ORAL | Status: DC
  Administered 2019-12-27 – 2019-12-29 (×3): 5 mg via ORAL
  Filled 2019-12-27 (×3): qty 1

## 2019-12-27 MED ORDER — SODIUM CHLORIDE 0.9 % IV SOLN: 100.0000 mL | INTRAVENOUS | Status: DC | PRN

## 2019-12-27 MED ORDER — LIDOCAINE-PRILOCAINE 2.5-2.5 % EX CREA
1.0000 "application " | TOPICAL_CREAM | CUTANEOUS | Status: DC | PRN
  Filled 2019-12-27: qty 5

## 2019-12-27 MED ORDER — INSULIN ASPART 100 UNIT/ML ~~LOC~~ SOLN
2.0000 [IU] | Freq: Three times a day (TID) | SUBCUTANEOUS | Status: DC
  Administered 2019-12-27 – 2019-12-29 (×2): 2 [IU] via SUBCUTANEOUS

## 2019-12-27 MED ORDER — LIDOCAINE HCL (PF) 1 % IJ SOLN: 5.0000 mL | INTRAMUSCULAR | Status: DC | PRN

## 2019-12-27 MED ORDER — CHLORHEXIDINE GLUCONATE CLOTH 2 % EX PADS
6.0000 | MEDICATED_PAD | Freq: Every day | CUTANEOUS | Status: DC
  Administered 2019-12-27 – 2019-12-28 (×2): 6 via TOPICAL

## 2019-12-27 MED ORDER — SODIUM CHLORIDE 0.9% FLUSH: 3.0000 mL | Freq: Two times a day (BID) | INTRAVENOUS | Status: DC

## 2019-12-27 MED ORDER — HEPARIN BOLUS VIA INFUSION
4000.0000 [IU] | Freq: Once | INTRAVENOUS | Status: AC
  Administered 2019-12-27: 4000 [IU] via INTRAVENOUS
  Filled 2019-12-27: qty 4000

## 2019-12-27 MED ORDER — SEVELAMER CARBONATE 800 MG PO TABS
800.0000 mg | ORAL_TABLET | Freq: Three times a day (TID) | ORAL | Status: DC
  Administered 2019-12-27 – 2019-12-29 (×4): 800 mg via ORAL
  Filled 2019-12-27 (×4): qty 1

## 2019-12-27 MED ORDER — SODIUM CHLORIDE 0.9 % IV SOLN: 250.0000 mL | INTRAVENOUS | Status: DC | PRN

## 2019-12-27 MED ORDER — ACETAMINOPHEN 325 MG PO TABS: 650.0000 mg | ORAL_TABLET | Freq: Four times a day (QID) | ORAL | Status: DC | PRN

## 2019-12-27 MED ORDER — ATORVASTATIN CALCIUM 80 MG PO TABS
80.0000 mg | ORAL_TABLET | Freq: Every day | ORAL | Status: DC
  Administered 2019-12-27 – 2019-12-29 (×3): 80 mg via ORAL
  Filled 2019-12-27 (×2): qty 2
  Filled 2019-12-27: qty 1

## 2019-12-27 MED ORDER — ACETAMINOPHEN 650 MG RE SUPP: 650.0000 mg | Freq: Four times a day (QID) | RECTAL | Status: DC | PRN

## 2019-12-27 MED ORDER — ASPIRIN 81 MG PO CHEW
81.0000 mg | CHEWABLE_TABLET | Freq: Every day | ORAL | Status: DC
  Administered 2019-12-27 – 2019-12-29 (×3): 81 mg via ORAL
  Filled 2019-12-27 (×3): qty 1

## 2019-12-27 MED ORDER — HEPARIN SODIUM (PORCINE) 1000 UNIT/ML DIALYSIS
1000.0000 [IU] | INTRAMUSCULAR | Status: DC | PRN
  Filled 2019-12-27: qty 1

## 2019-12-27 MED ORDER — ALLOPURINOL 100 MG PO TABS
100.0000 mg | ORAL_TABLET | Freq: Every day | ORAL | Status: DC
  Administered 2019-12-27 – 2019-12-29 (×3): 100 mg via ORAL
  Filled 2019-12-27 (×3): qty 1

## 2019-12-27 MED ORDER — ONDANSETRON HCL 4 MG/2ML IJ SOLN: 4.0000 mg | Freq: Four times a day (QID) | INTRAMUSCULAR | Status: DC | PRN

## 2019-12-27 MED ORDER — CALCIUM GLUCONATE-NACL 1-0.675 GM/50ML-% IV SOLN
1.0000 g | Freq: Once | INTRAVENOUS | Status: AC
  Administered 2019-12-27: 1000 mg via INTRAVENOUS
  Filled 2019-12-27: qty 50

## 2019-12-27 MED ORDER — INSULIN ASPART 100 UNIT/ML ~~LOC~~ SOLN
0.0000 [IU] | Freq: Three times a day (TID) | SUBCUTANEOUS | Status: DC
  Administered 2019-12-29: 1 [IU] via SUBCUTANEOUS

## 2019-12-27 MED ORDER — ONDANSETRON HCL 4 MG PO TABS: 4.0000 mg | ORAL_TABLET | Freq: Four times a day (QID) | ORAL | Status: DC | PRN

## 2019-12-27 MED ORDER — PENTAFLUOROPROP-TETRAFLUOROETH EX AERO: 1.0000 "application " | INHALATION_SPRAY | CUTANEOUS | Status: DC | PRN

## 2019-12-27 MED ORDER — INSULIN ASPART 100 UNIT/ML IV SOLN
5.0000 [IU] | Freq: Once | INTRAVENOUS | Status: AC
  Administered 2019-12-27: 09:00:00 5 [IU] via INTRAVENOUS

## 2019-12-27 MED ORDER — ALBUTEROL SULFATE HFA 108 (90 BASE) MCG/ACT IN AERS
8.0000 | INHALATION_SPRAY | Freq: Once | RESPIRATORY_TRACT | Status: AC
  Administered 2019-12-27: 09:00:00 8 via RESPIRATORY_TRACT
  Filled 2019-12-27: qty 6.7

## 2019-12-27 MED ORDER — ALTEPLASE 2 MG IJ SOLR: 2.0000 mg | Freq: Once | INTRAMUSCULAR | Status: DC | PRN

## 2019-12-27 MED ORDER — HEPARIN (PORCINE) 25000 UT/250ML-% IV SOLN
1500.0000 [IU]/h | INTRAVENOUS | Status: DC
  Administered 2019-12-27: 1200 [IU]/h via INTRAVENOUS
  Administered 2019-12-28: 1300 [IU]/h via INTRAVENOUS
  Filled 2019-12-27 (×2): qty 250

## 2019-12-27 MED ORDER — CARVEDILOL 25 MG PO TABS
25.0000 mg | ORAL_TABLET | Freq: Two times a day (BID) | ORAL | Status: DC
  Administered 2019-12-27 – 2019-12-29 (×5): 25 mg via ORAL
  Filled 2019-12-27: qty 2
  Filled 2019-12-27 (×3): qty 1
  Filled 2019-12-27: qty 2

## 2019-12-27 MED ORDER — SODIUM CHLORIDE 0.9% FLUSH: 3.0000 mL | INTRAVENOUS | Status: DC | PRN

## 2019-12-27 MED ORDER — INSULIN ASPART 100 UNIT/ML ~~LOC~~ SOLN: 0.0000 [IU] | Freq: Every day | SUBCUTANEOUS | Status: DC

## 2019-12-27 NOTE — Progress Notes (Signed)
Communicated with Dr. Manuella Ghazi and Melina Copa regarding pt's elevated troponins; pt is ok to stay on tele floor for now due to no chest pain. Pt is currently on heparin drip at 12 ml/hr and received an 4000 ml bolus prior to drip being started. Plan is to talk to [facility] team in the am with hopes of pt having a cardiac cath tomorrow. Pt will be npo after midnight and heparin drip will continue. Troponin levels to be drawn q2h now and will report if pt has any c/o chest pain. Deirdre Pippins, RN

## 2019-12-27 NOTE — ED Notes (Signed)
Called [phone] no answer

## 2019-12-27 NOTE — Progress Notes (Signed)
ANTICOAGULATION CONSULT NOTE - Initial Consult  Pharmacy Consult for heparin Indication: chest pain/ACS  Allergies  Allergen Reactions  . No Known Allergies     Patient Measurements: Height: 5\' 7"  (170.2 cm) Weight: 295 lb 13.7 oz (134.2 kg) IBW/kg (Calculated) : 66.1 HEPARIN DW (KG): 98.3  Vital Signs: Temp: 98.8 F (37.1 C) (01/18 1345) Temp Source: Oral (01/18 1345) BP: 104/64 (01/18 1706) Pulse Rate: 110 (01/18 1706)  Labs: Recent Labs    12/27/19 0749 12/27/19 1034 12/27/19 1152 12/27/19 1513 12/27/19 1810  HGB 11.5*  --  11.2*  --   --   HCT 37.2*  --  35.6*  --   --   PLT 178  --  188  --   --   HEPARINUNFRC  --   --   --   --  0.29*  CREATININE 11.54*  --  12.14*  --   --   TROPONINIHS 149* 1,009*  --  2,648*  --     Estimated Creatinine Clearance: 9 mL/min (A) (by C-G formula based on SCr of 12.14 mg/dL (H)).   Medical History: Past Medical History:  Diagnosis Date  . Chronic respiratory failure ([place])    a. diagnosed with COPD, started on O2 in 2016.  . Diabetes mellitus ([place])    a. a1c 7.0 on 11/2019.  Marland Kitchen ESRD on hemodialysis ([place])   . Gout   . Hyperlipidemia    mixed. Fair control with current meds.  . Hypertension   . Mild anemia   . Morbid obesity ([place])    Pt followed by a [facility] Clinic.  Marland Kitchen OSA (obstructive sleep apnea)   . Pneumonia due to COVID-19 virus    Admitted 11/2019-12/2019    Medications:  Medications Prior to Admission  Medication Sig Dispense Refill Last Dose  . albuterol (VENTOLIN HFA) 108 (90 Base) MCG/ACT inhaler Inhale 2 puffs into the lungs every 6 (six) hours as needed for wheezing or shortness of breath.   12/26/2019 at Unknown time  . allopurinol (ZYLOPRIM) 100 MG tablet Take 100 mg by mouth daily.   12/27/2019 at Unknown time  . ascorbic acid (VITAMIN C) 500 MG tablet Take 1 tablet (500 mg total) by mouth daily. X 5 days   12/26/2019 at Unknown time  . aspirin 81 MG chewable tablet Chew 1 tablet by mouth daily.    12/26/2019 at 1730  . atorvastatin (LIPITOR) 80 MG tablet Take 1 tablet by mouth daily.   12/26/2019 at Unknown time  . carvedilol (COREG) 25 MG tablet Take 25 mg by mouth 2 (two) times daily with a meal.   12/26/2019 at 1730  . insulin aspart protamine- aspart (NOVOLOG MIX 70/30) (70-30) 100 UNIT/ML injection Inject 42 Units into the skin 2 (two) times daily.   12/27/2019 at Unknown time  . sevelamer carbonate (RENVELA) 800 MG tablet Take 800 mg by mouth 3 (three) times daily with meals.   12/27/2019 at Unknown time  . zinc sulfate 220 (50 Zn) MG capsule Take 1 capsule (220 mg total) by mouth daily. X 5 days   12/26/2019 at Unknown time  . dexamethasone (DECADRON) 6 MG tablet Take 1 tablet (6 mg total) by mouth daily. (Patient not taking: Reported on 12/27/2019) 5 tablet 0 Not Taking at Unknown time    Assessment: 59 year old male with a recent history of COVID-19 pneumonia, morbid obesity, obstructive sleep apnea, end-stage renal disease on hemodialysis, and chronic hypoxic respiratory failure on home oxygen. He has recently been experiencing hypoxemic  episodes with exertion.  He had some mild chest tightness with exertion but denied chest pain per se.D-dimer of 0.62, high-sensitivity troponins of 149. IV heparin  To start until VQ scan, ECHO , and additional troponins available. Pharmacy asked to dose heparin.   HL 0.29, subtherapeutic   Goal of Therapy:  Heparin level 0.3-0.7 units/ml Monitor platelets by anticoagulation protocol: Yes   Plan:  Increase heparin infusion to 1300 units/hr Check anti-Xa level daily while on heparin Continue to monitor H&H and platelets  Thomasenia Sales, PharmD, MBA, BCGP Clinical Pharmacist  12/27/2019,7:29 PM

## 2019-12-27 NOTE — ED Notes (Signed)
Tiffany, [facility] notified re: need to update nuc med on pending test, no answer when this RN attempted to notify nuc med department x 2 today, AC to follow up

## 2019-12-27 NOTE — Progress Notes (Signed)
CRITICAL VALUE ALERT  Critical Value:  Troponin 2648  Date & Time Notied:  12/27/19 @ 1520  Provider Notified: Dr. Manuella Ghazi  Orders Received/Actions taken: see orders  Deirdre Pippins, RN

## 2019-12-27 NOTE — Progress Notes (Signed)
   Reviewed f/u information with Dr. Bronson Ing. VQ scan negative for PE. 2D echo shows normal LVEF/RVEF. Grade 1 DD. Dilated IVC. F/u hsTroponins 149->1009->2648. Clinical presentation seems c/w NSTEMI. Will plan to make NPO after midnight and discuss with [facility] intervention[facility] team to consider transfer for cath tomorrow. Attempted to call up to room to patient but no answer, so relayed plan to nurse to relay to patient - our team will discuss further with him in the morning.  Montarius Kitagawa PA-C

## 2019-12-27 NOTE — ED Notes (Signed)
Called to [phone] no answer

## 2019-12-27 NOTE — ED Notes (Signed)
Pts extremities have warmed up, pt O2 sats documented

## 2019-12-27 NOTE — Progress Notes (Signed)
*  PRELIMINARY RESULTS* Echocardiogram Limited 2-D Echocardiogram has been performed.  Samuel Germany 12/27/2019, 2:03 PM

## 2019-12-27 NOTE — Progress Notes (Signed)
CRITICAL VALUE ALERT  Critical Value: tropnin [id number]  Date & Time Notied:  12/27/19 9:16 PM   Provider Notified:midleve Orders Received/Actions taken: waiting response

## 2019-12-27 NOTE — Procedures (Signed)
    HEMODIALYSIS TREATMENT NOTE:  Pt had been dialyzing on TTS at [facility] in [place] due to Covid+ status.  He was due back at his home clinic in [place] on Monday 1/18 to resume his regular MWF schedule.  Repeat K 5.4 after temporizing measures in ED / pre-dialysis.  Ran 47m on 1K bath, then remainder of time on 2K bath.  BP low at treatment onset (SBP<95); no UF for first 30 minutes. 1.5 liters removed, as tolerated.  All blood was returned and hemostasis was achieved in 15 minutes.   Rockwell Alexandria, RN

## 2019-12-27 NOTE — ED Triage Notes (Signed)
Pt with c/o SOB and low blood sugar. Per EMS, blood sugar was 60 but pt had something to eat and drink and it improved.

## 2019-12-27 NOTE — Progress Notes (Signed)
CRITICAL VALUE ALERT  Critical Value:  Troponin 4663  Date & Time Notied:  12/27/19 11:44 PM   Provider Notified: midlevel  Orders Received/Actions taken: none

## 2019-12-27 NOTE — ED Notes (Signed)
Date and time results received: 12/27/19 0833 (use smartphrase ".now" to insert current time)  Test: k+ Critical Value: 6.9  Name of Provider Notified: goldston  Orders Received? Or Actions Taken?: see chart

## 2019-12-27 NOTE — ED Notes (Signed)
Date and time results received: 12/27/19 0833 (use smartphrase ".now" to insert current time)  Test: trop Critical Value: 149  Name of Provider Notified: goldston  Orders Received? Or Actions Taken?: see chart

## 2019-12-27 NOTE — Progress Notes (Signed)
CRITICAL VALUE ALERT  Critical Value:  Troponin 1009  Date & Time Notied:  12/27/19 @ 1040  Provider Notified: Dr. Manuella Ghazi  Orders Received/Actions taken: see orders  Deirdre Pippins, RN

## 2019-12-27 NOTE — Progress Notes (Signed)
ANTICOAGULATION CONSULT NOTE - Initial Consult  Pharmacy Consult for heparin Indication: chest pain/ACS  Allergies  Allergen Reactions  . No Known Allergies     Patient Measurements: Height: 5\' 7"  (170.2 cm) Weight: 297 lb (134.7 kg) IBW/kg (Calculated) : 66.1 HEPARIN DW (KG): 98.3  Vital Signs: Temp: 99 F (37.2 C) (01/18 1107) Temp Source: Oral (01/18 1107) BP: 136/91 (01/18 1107) Pulse Rate: 105 (01/18 1107)  Labs: Recent Labs    12/27/19 0749 12/27/19 1034 12/27/19 1152  HGB 11.5*  --  11.2*  HCT 37.2*  --  35.6*  PLT 178  --  188  CREATININE 11.54*  --   --   TROPONINIHS 149* 1,009*  --     Estimated Creatinine Clearance: 9.5 mL/min (A) (by C-G formula based on SCr of 11.54 mg/dL (H)).   Medical History: Past Medical History:  Diagnosis Date  . Chronic respiratory failure ([place])    a. diagnosed with COPD, started on O2 in 2016.  . Diabetes mellitus ([place])    a. a1c 7.0 on 11/2019.  Marland Kitchen ESRD on hemodialysis ([place])   . Gout   . Hyperlipidemia    mixed. Fair control with current meds.  . Hypertension   . Mild anemia   . Morbid obesity ([place])    Pt followed by a Nutrition Clinic.  Marland Kitchen OSA (obstructive sleep apnea)   . Pneumonia due to COVID-19 virus    Admitted 11/2019-12/2019    Medications:  Medications Prior to Admission  Medication Sig Dispense Refill Last Dose  . albuterol (VENTOLIN HFA) 108 (90 Base) MCG/ACT inhaler Inhale 2 puffs into the lungs every 6 (six) hours as needed for wheezing or shortness of breath.   12/26/2019 at Unknown time  . allopurinol (ZYLOPRIM) 100 MG tablet Take 100 mg by mouth daily.   12/27/2019 at Unknown time  . ascorbic acid (VITAMIN C) 500 MG tablet Take 1 tablet (500 mg total) by mouth daily. X 5 days   12/26/2019 at Unknown time  . aspirin 81 MG chewable tablet Chew 1 tablet by mouth daily.   12/26/2019 at 1730  . atorvastatin (LIPITOR) 80 MG tablet Take 1 tablet by mouth daily.   12/26/2019 at Unknown time  . carvedilol  (COREG) 25 MG tablet Take 25 mg by mouth 2 (two) times daily with a meal.   12/26/2019 at 1730  . insulin aspart protamine- aspart (NOVOLOG MIX 70/30) (70-30) 100 UNIT/ML injection Inject 42 Units into the skin 2 (two) times daily.   12/27/2019 at Unknown time  . sevelamer carbonate (RENVELA) 800 MG tablet Take 800 mg by mouth 3 (three) times daily with meals.   12/27/2019 at Unknown time  . zinc sulfate 220 (50 Zn) MG capsule Take 1 capsule (220 mg total) by mouth daily. X 5 days   12/26/2019 at Unknown time  . dexamethasone (DECADRON) 6 MG tablet Take 1 tablet (6 mg total) by mouth daily. (Patient not taking: Reported on 12/27/2019) 5 tablet 0 Not Taking at Unknown time    Assessment: 59 year old male with a recent history of COVID-19 pneumonia, morbid obesity, obstructive sleep apnea, end-stage renal disease on hemodialysis, and chronic hypoxic respiratory failure on home oxygen. He has recently been experiencing hypoxemic episodes with exertion.  He had some mild chest tightness with exertion but denied chest pain per se.D-dimer of 0.62, high-sensitivity troponins of 149. IV heparin  To start until VQ scan, ECHO , and additional troponins available. Pharmacy asked to dose heparin.  Goal of Therapy:  Heparin level 0.3-0.7 units/ml Monitor platelets by anticoagulation protocol: Yes   Plan:  Give 4000 units bolus x 1 Start heparin infusion at 1200 units/hr Check anti-Xa level in ~6-8 hours hours and daily while on heparin Continue to monitor H&H and platelets  Isac Sarna, BS [place]ena [place], BCPS Clinical Pharmacist Pager [phone] 12/27/2019,12:17 PM

## 2019-12-27 NOTE — Consult Note (Addendum)
Cardiology Consultation:   Due to the COVID-19 pandemic, the initial prep visit was completed with telemedicine (audio) technology to reduce patient and provider exposure as well as to preserve personal protective equipment.   Patient ID: Jesus Williams MRN: [id number]; DOB: 1961-03-27  Admit date: 12/27/2019 Date of Consult: 12/27/2019  Primary Care Provider: System, Provider Not In Primary Cardiologist: Kate Sable, MD (new) Primary Electrophysiologist:  None   Patient Profile:   Jesus Williams is a 59 y.o. male with a hx of recent Covid-19 PNA, morbid obesity, OSA (intolenrant of CPAP), ESRD on HD x 3 years, hyponatremia, gouty arthritis, HLD, chronic respiratory failure on home O2, DM (A1C 7.0 12/07/19), RBBB, mild anemia (during recent admit) who is being seen today for the evaluation of shortness of breath, abnormal EKG/troponin at the request of Dr. Regenia Skeeter.  History of Present Illness:   Jesus Williams is traditionally followed at the [facility]. He does not see a cardiologist. He recalls back in 2004 when he was diagnosed with DM/HTN that his bloodwork showed 2 light heart attacks but he never required a heart cath or ischemic testing. He recalls being told he had congestive heart failure but does not recall ever being informed of any heart muscle weakness. He went on HD around 3 years ago and dialyzes MWF. He also states he was put on chronic oxygen, 3L per minute, for possible COPD around 2016.  He was recently admitted 12/06/19-12/11/19 with Covid-19 pneumonia. He had presented with fever, worsening dyspnea x 1 week, yellow sputum cough, mild sore throat and decreased oral intake. He was hypotensive, tachycardic and hypoxic on arrival. He was treated with dexamethasone and remdesevir. He was able to be weaned back to baseline home O2 of 2.5L at discharge. Did not require cardiology involvement during that admission; EKG showed RBBB. HR near discharge was  70s-90.  He was discharged and changed dialysis centers to [facility]. He said at first he felt back to himself after discharge. However, after about a week he began to notice his oxygen dropping with walking, such as walking out to his car. He also noticed he would get SOB associated with some tightness across his chest whenever he had to breath shallow. He has to stop and rest for it to improve. This felt similar to when he had Covid the first time. No overt chest pain. Denies any swelling, orthopnea, palpitations, or syncope.  Labs on arrival reveal hyperkalemia of 6.5, glucose 327, Cr 11.54, albumin 3.2, Hgb 11.5 (macrocytic), d-dimer 0.62 (discharge d-dimer 0.50, as high as 2.77 last admission), hsTroponin 149 (repeat pending). VQ scan is pending. He received calcium gluconate for his hyperkalemia and is being seen by nephro. He was also given an albuterol nebulizer which the patient states thinks helped quite a bit. He is satting 100% on 2L.  Past Medical History:  Diagnosis Date  . Chronic respiratory failure ([place])    a. diagnosed with COPD, started on O2 in 2016.  . Diabetes mellitus ([place])    a. a1c 7.0 on 11/2019.  Marland Kitchen ESRD on hemodialysis ([place])   . Gout   . Hyperlipidemia    mixed. Fair control with current meds.  . Hypertension   . Mild anemia   . Morbid obesity ([place])    Pt followed by a Nutrition Clinic.  Marland Kitchen OSA (obstructive sleep apnea)   . Pneumonia due to COVID-19 virus    Admitted 11/2019-12/2019    Past Surgical History:  Procedure Laterality Date  .  AV FISTULA INSERTION W/ RF MAGNETIC GUIDANCE       Home Medications:  Prior to Admission medications   Medication Sig Start Date End Date Taking? Authorizing Provider  albuterol (VENTOLIN HFA) 108 (90 Base) MCG/ACT inhaler Inhale 2 puffs into the lungs every 6 (six) hours as needed for wheezing or shortness of breath.   Yes [provider]  allopurinol (ZYLOPRIM) 100 MG tablet Take 100 mg by mouth daily.   Yes  [provider]  ascorbic acid (VITAMIN C) 500 MG tablet Take 1 tablet (500 mg total) by mouth daily. X 5 days 12/11/19  Yes Tat, Shanon Brow, MD  aspirin 81 MG chewable tablet Chew 1 tablet by mouth daily. 07/02/16  Yes [provider]  atorvastatin (LIPITOR) 80 MG tablet Take 1 tablet by mouth daily. 07/02/16  Yes [provider]  carvedilol (COREG) 25 MG tablet Take 25 mg by mouth 2 (two) times daily with a meal.   Yes [provider]  insulin aspart protamine- aspart (NOVOLOG MIX 70/30) (70-30) 100 UNIT/ML injection Inject 42 Units into the skin 2 (two) times daily. 07/02/16  Yes [provider]  sevelamer carbonate (RENVELA) 800 MG tablet Take 800 mg by mouth 3 (three) times daily with meals.   Yes [provider]  zinc sulfate 220 (50 Zn) MG capsule Take 1 capsule (220 mg total) by mouth daily. X 5 days 12/11/19  Yes Tat, Shanon Brow, MD  dexamethasone (DECADRON) 6 MG tablet Take 1 tablet (6 mg total) by mouth daily. Patient not taking: Reported on 12/27/2019 12/12/19   Orson Eva, MD    Inpatient Medications: Scheduled Meds: . Chlorhexidine Gluconate Cloth  6 each Topical Q0600   Continuous Infusions:  PRN Meds:   Allergies:    Allergies  Allergen Reactions  . No Known Allergies     Social History:   Social History   Socioeconomic History  . Marital status: Single    Spouse name: Not on file  . Number of children: Not on file  . Years of education: Not on file  . Highest education level: Not on file  Occupational History  . Not on file  Tobacco Use  . Smoking status: Never Smoker  . Smokeless tobacco: Never Used  Substance and Sexual Activity  . Alcohol use: Not Currently    Comment: very little, once every 6 months  . Drug use: Never  . Sexual activity: Not on file  Other Topics Concern  . Not on file  Social History Narrative  . Not on file   Social Determinants of Health   Financial Resource Strain:   . Difficulty of  Paying Living Expenses: Not on file  Food Insecurity:   . Worried About Charity fundraiser in the Last Year: Not on file  . Ran Out of Food in the Last Year: Not on file  Transportation Needs:   . Lack of Transportation (Medical): Not on file  . Lack of Transportation (Non-Medical): Not on file  Physical Activity:   . Days of Exercise per Week: Not on file  . Minutes of Exercise per Session: Not on file  Stress:   . Feeling of Stress : Not on file  Social Connections:   . Frequency of Communication with Friends and Family: Not on file  . Frequency of Social Gatherings with Friends and Family: Not on file  . Attends Religious Services: Not on file  . Active Member of Clubs or Organizations: Not on file  .  Attends Archivist Meetings: Not on file  . Marital Status: Not on file  Intimate Partner Violence:   . Fear of Current or Ex-Partner: Not on file  . Emotionally Abused: Not on file  . Physically Abused: Not on file  . Sexually Abused: Not on file    Family History:   Family History  Problem Relation Age of Onset  . Hypertension Mother      ROS:  Please see the history of present illness.  All other ROS reviewed and negative.     Physical Exam/Data:   Vitals:   12/27/19 0815 12/27/19 0830 12/27/19 0900 12/27/19 0930  BP:  118/78 128/84 128/86  Pulse: (!) 102   (!) 101  Resp: 15 (!) 23 20   Temp:      TempSrc:      SpO2: 98%   100%  Weight:      Height:       No intake or output data in the 24 hours ending 12/27/19 1039 Last 3 Weights 12/27/2019 12/27/2019 12/11/2019  Weight (lbs) 297 lb 295 lb 13.7 oz 295 lb 13.7 oz  Weight (kg) 134.718 kg 134.2 kg 134.2 kg     Body mass index is 46.52 kg/m.   VITAL SIGNS:  reviewed  General - adult male in no acute distress Pulm - No labored breathing, no coughing during visit, no audible wheezing, speaking in full sentences Neuro - A+Ox3, no slurred speech, answers questions appropriately Psych - Pleasant  affect  EKG:  The EKG was personally reviewed and demonstrates: sinus tachycardia 108bpm with RBBB, more pronounced ST depression/TWI inferiorly as well as V2-V5 than prior  Relevant CV Studies: n/a  Laboratory Data:  Chemistry Recent Labs  Lab 12/27/19 0749  NA 134*  K 6.5*  CL 96*  CO2 21*  GLUCOSE 327*  BUN 58*  CREATININE 11.54*  CALCIUM 8.6*  GFRNONAA 4*  GFRAA 5*  ANIONGAP 17*    Recent Labs  Lab 12/27/19 0749  PROT 7.3  ALBUMIN 3.2*  AST 21  ALT 28  ALKPHOS 118  BILITOT 0.9   Hematology Recent Labs  Lab 12/27/19 0749  WBC 7.7  RBC 3.58*  HGB 11.5*  HCT 37.2*  MCV 103.9*  MCH 32.1  MCHC 30.9  RDW 16.8*  PLT 178   Cardiac EnzymesNo results for input(s): TROPONINI in the last 168 hours. No results for input(s): TROPIPOC in the last 168 hours.  BNPNo results for input(s): BNP, PROBNP in the last 168 hours.  DDimer  Recent Labs  Lab 12/27/19 0749  DDIMER 0.62*    Radiology/Studies:  DG Chest Portable 1 View  Result Date: 12/27/2019 CLINICAL DATA:  Follow-up COVID-19 pneumonia. EXAM: PORTABLE CHEST 1 VIEW COMPARISON:  12/09/2019 and earlier. FINDINGS: Cardiac silhouette markedly enlarged, unchanged. Previously identified patchy ground-glass airspace opacities in the lung bases have resolved. Lungs now clear. Pulmonary vascularity normal without evidence of pulmonary edema. No visible pleural effusions. IMPRESSION: Resolution of the previously identified pneumonia involving the lung bases. No acute cardiopulmonary disease currently. Electronically Signed   By: Evangeline Dakin M.D.   On: 12/27/2019 08:06    Assessment and Plan:   1. Shortness of breath and chest tightness - patient reports worsening hypoxia on ambulation over the last 1 week. Cause of his baseline chronic respiratory failure is not clear to me; he cites possible COPD in 2016. He is a nonsmoker. May also have contribution of OHS given morbid obesity/OSA. Regardless, he reports a  definite  decline since his initial improvement from Covid-19. His CXR also demonstrates improvement. He is now newly tachycardic (sinus). EKG shows RBBB with accentuated ST depression compared to prior. He has not had any rest angina. Differential includes pulmonary embolism, myocarditis, ischemic heart disease, pericardial effusion or development of pneumonitis as a consequence of Covid-19 infection. Consider pulmonary consultation. Agree with cycling of troponins and echocardiogram.  2. Elevated troponin - no known formal hx of CAD. Difficult to know what this represents in context of acute physiologic stressors and ESRD. Agree with trending troponins. Would not be unreasonable to place patient on IV heparin - will review with Dr. Bronson Ing at least until echo is back and PE has been excluded. Would suggest continuing home ASA, carvedilol and Lipitor. F/u lipid profile in AM.  3. ESRD on HD with hyperkalemia - per nephrology.  4. Morbid obesity - once recovered would benefit from lifestyle management. Has not been able to tolerate CPAP because it makes him feel worse.  5. DM - per IM. Glucose 327 on admission. Most recent A1C 7.0.  For questions or updates, please contact [place] Please consult [email or web address] for contact info under     Signed, Charlie Pitter, PA-C  12/27/2019 10:39 AM   The EMR was reviewed and I spoke with the ED physician.  I attempted to conduct a phone interview but could not reach the patient.  He is a 59 year old male with a recent history of COVID-19 pneumonia, morbid obesity, obstructive sleep apnea, end-stage renal disease on hemodialysis, and chronic hypoxic respiratory failure on home oxygen.  He has recently been experiencing hypoxemic episodes with exertion.  He had some mild chest tightness with exertion but denied chest pain per se.  He denies orthopnea, paroxysmal nocturnal dyspnea, palpitations, and syncope.  Labs reviewed above with D-dimer of 0.62,  high-sensitivity troponins of 149.  He was also hyperkalemic with a potassium of 6.5.  The ED physician called me given the patient's progressive exertional dyspnea.  He missed dialysis this morning.  Chest x-ray showed resolution of previous identified pneumonia via involving the lung bases.  VQ scan has been ordered and is pending.  I personally reviewed the ECG which demonstrated sinus tachycardia with right bundle branch block and ST depressions inferiorly and V2 through V5 with 2 mm ST depressions in V3.  ST depressions and T wave inversions are more pronounced than previous ECGs in late December.  An echocardiogram has been ordered and is pending.  Initial troponin is nonspecifically elevated in the setting of end-stage renal disease.  I would recommend continued cycling of troponins.  Continue home aspirin, carvedilol and atorvastatin.  If okay with nephrology, could consider starting IV heparin preemptively until VQ scan, echocardiogram, and additional troponins are resulted.   Kate Sable, MD, [facility]  12/27/2019 11:31 AM

## 2019-12-27 NOTE — ED Notes (Signed)
Called [phone]

## 2019-12-27 NOTE — Consult Note (Signed)
Referring Provider: No ref. provider found Primary Care Physician:  System, Provider Not In Primary Nephrologist:  Dr. Geralyn Flash  Reason for Consultation: Medical management of end-stage renal disease.  Maintenance of euvolemia.  Evaluation and treatment of anemia and secondary hyperparathyroidism  HPI: This is a 59 year old gentleman with a history of end-stage renal disease Monday Wednesday Friday dialysis at [facility] is a history of diabetes and hypertension presented to the emergency room with shortness of breath.  His outpatient dialysis orders are as follows  Outpatient dialysis orders MWF 4 hours and 55minutes  2K/2.5 calcium bath  EDW is 132 kg  Heparin 3000 units load and hourly dose of 1200 units stop 1 hour before end of dialysis epogen 1200 units IVP three times weekly  hectorol 3.5 mcg three times weekly  venofer 50 mg once weekly  He was last hospitalized 12/06/2019 to 12/11/2019 with a diagnosis of acute on chronic respiratory failure and hypoxia secondary to Covid pneumonia.  Blood pressure 128/86 pulse 104 temperature 97.8 O2 sats 100% 2 L nasal cannula  Sodium 134 potassium 6.5 chloride 96 CO2 21 BUN 58 creatinine 11.54 glucose 327 calcium 8.6 alkaline phosphatase 118 albumin 3.2 AST 21 ALT 28 troponin I 49 WBC 7.7 hemoglobin 11.5 platelets 178  Home medication review allopurinol 100 mg daily aspirin 81 mg daily atorvastatin 80 mg daily Coreg 25 mg twice daily insulin 70/30 42 units twice daily, Renvela 800 mg 3 times daily zinc 1 capsule daily  VQ scan pending  Chest x-ray resolution of previously identified pneumonia   Past Medical History:  Diagnosis Date  . Chronic kidney disease    chronic  . Gout   . Hyperlipidemia    mixed. Fair control with current meds.  . Hypertension   . OSA (obstructive sleep apnea)   . Severe obesity ([place])    Pt followed by a [facility].    Past Surgical History:  Procedure Laterality Date  . AV FISTULA INSERTION W/ RF  MAGNETIC GUIDANCE      Prior to Admission medications   Medication Sig Start Date End Date Taking? Authorizing Provider  albuterol (VENTOLIN HFA) 108 (90 Base) MCG/ACT inhaler Inhale 2 puffs into the lungs every 6 (six) hours as needed for wheezing or shortness of breath.    [provider]  allopurinol (ZYLOPRIM) 100 MG tablet Take 100 mg by mouth daily.    [provider]  ascorbic acid (VITAMIN C) 500 MG tablet Take 1 tablet (500 mg total) by mouth daily. X 5 days 12/11/19   Orson Eva, MD  aspirin 81 MG chewable tablet Chew 1 tablet by mouth daily. 07/02/16   [provider]  atorvastatin (LIPITOR) 80 MG tablet Take 1 tablet by mouth daily. 07/02/16   [provider]  carvedilol (COREG) 25 MG tablet Take 25 mg by mouth 2 (two) times daily with a meal.    [provider]  dexamethasone (DECADRON) 6 MG tablet Take 1 tablet (6 mg total) by mouth daily. 12/12/19   Orson Eva, MD  insulin aspart protamine- aspart (NOVOLOG MIX 70/30) (70-30) 100 UNIT/ML injection Inject 42 Units into the skin 2 (two) times daily. 07/02/16   [provider]  sevelamer carbonate (RENVELA) 800 MG tablet Take 800 mg by mouth 3 (three) times daily with meals.    [provider]  zinc sulfate 220 (50 Zn) MG capsule Take 1 capsule (220 mg total) by mouth daily. X 5 days 12/11/19   Orson Eva, MD  No current facility-administered medications for this encounter.   Current Outpatient Medications  Medication Sig Dispense Refill  . albuterol (VENTOLIN HFA) 108 (90 Base) MCG/ACT inhaler Inhale 2 puffs into the lungs every 6 (six) hours as needed for wheezing or shortness of breath.    . allopurinol (ZYLOPRIM) 100 MG tablet Take 100 mg by mouth daily.    Marland Kitchen ascorbic acid (VITAMIN C) 500 MG tablet Take 1 tablet (500 mg total) by mouth daily. X 5 days    . aspirin 81 MG chewable tablet Chew 1 tablet by mouth daily.    Marland Kitchen atorvastatin (LIPITOR) 80 MG tablet Take 1 tablet by  mouth daily.    . carvedilol (COREG) 25 MG tablet Take 25 mg by mouth 2 (two) times daily with a meal.    . dexamethasone (DECADRON) 6 MG tablet Take 1 tablet (6 mg total) by mouth daily. 5 tablet 0  . insulin aspart protamine- aspart (NOVOLOG MIX 70/30) (70-30) 100 UNIT/ML injection Inject 42 Units into the skin 2 (two) times daily.    . sevelamer carbonate (RENVELA) 800 MG tablet Take 800 mg by mouth 3 (three) times daily with meals.    . zinc sulfate 220 (50 Zn) MG capsule Take 1 capsule (220 mg total) by mouth daily. X 5 days      Allergies as of 12/27/2019  . (No Known Allergies)    Family History  Problem Relation Age of Onset  . Hypertension Mother     Social History   Socioeconomic History  . Marital status: Single    Spouse name: Not on file  . Number of children: Not on file  . Years of education: Not on file  . Highest education level: Not on file  Occupational History  . Not on file  Tobacco Use  . Smoking status: Never Smoker  . Smokeless tobacco: Never Used  Substance and Sexual Activity  . Alcohol use: Not Currently  . Drug use: Not Currently  . Sexual activity: Not on file  Other Topics Concern  . Not on file  Social History Narrative  . Not on file   Social Determinants of Health   Financial Resource Strain:   . Difficulty of Paying Living Expenses: Not on file  Food Insecurity:   . Worried About Charity fundraiser in the Last Year: Not on file  . Ran Out of Food in the Last Year: Not on file  Transportation Needs:   . Lack of Transportation (Medical): Not on file  . Lack of Transportation (Non-Medical): Not on file  Physical Activity:   . Days of Exercise per Week: Not on file  . Minutes of Exercise per Session: Not on file  Stress:   . Feeling of Stress : Not on file  Social Connections:   . Frequency of Communication with Friends and Family: Not on file  . Frequency of Social Gatherings with Friends and Family: Not on file  . Attends  Religious Services: Not on file  . Active Member of Clubs or Organizations: Not on file  . Attends Archivist Meetings: Not on file  . Marital Status: Not on file  Intimate Partner Violence:   . Fear of Current or Ex-Partner: Not on file  . Emotionally Abused: Not on file  . Physically Abused: Not on file  . Sexually Abused: Not on file    Review of Systems: Gen: Denies any fever, chills, sweats, anorexia, fatigue, weakness, malaise, weight loss, and sleep disorder HEENT:  No visual complaints, No history of Retinopathy. Normal external appearance No Epistaxis or Sore throat. No sinusitis.   CV: Denies chest pain, angina, palpitations, syncope, orthopnea, PND, peripheral edema, and claudication. Resp: Some dyspnea on exertion no dyspnea at rest.  Mild nonproductive cough GI: Denies vomiting blood, jaundice, and fecal incontinence.   Denies dysphagia or odynophagia. GU : Denies urinary burning, blood in urine, urinary frequency, urinary hesitancy, nocturnal urination, and urinary incontinence.  No renal calculi. MS: Denies joint pain, limitation of movement, and swelling, stiffness, low back pain, extremity pain. Denies muscle weakness, cramps, atrophy.  No use of non steroidal antiinflammatory drugs. Derm: Denies rash, itching, dry skin, hives, moles, warts, or unhealing ulcers.  Psych: Denies depression, anxiety, memory loss, suicidal ideation, hallucinations, paranoia, and confusion. Heme: Denies bruising, bleeding, and enlarged lymph nodes. Neuro: No headache.  No diplopia. No dysarthria.  No dysphasia.  No history of CVA.  No Seizures. No paresthesias.  No weakness. Endocrine diabetes mellitus type 2.  No Thyroid disease.  No Adrenal disease.  Physical Exam: Vital signs in last 24 hours: Temp:  [97.8 F (36.6 C)] 97.8 F (36.6 C) (01/18 0645) Pulse Rate:  [102-108] 102 (01/18 0815) Resp:  [15-23] 20 (01/18 0900) BP: (102-128)/(73-84) 128/84 (01/18 0900) SpO2:  [93 %-100  %] 98 % (01/18 0815) Weight:  [134.2 kg-134.7 kg] 134.7 kg (01/18 [id number])   General:   Nondistressed, comfortable Head:  Normocephalic and atraumatic. Eyes:  Sclera clear, no icterus.   Conjunctiva pink. Ears:  Normal auditory acuity. Nose:  No deformity, discharge,  or lesions. Mouth:  No deformity or lesions, dentition normal. Neck:  Supple; no masses or thyromegaly. JVP not elevated Lungs: Mild rhonchi throughout lung fields no distress Heart:  Regular rate and rhythm; no murmurs, clicks, rubs,  or gallops. Abdomen:  Soft, nontender and nondistended. No masses, hepatosplenomegaly or hernias noted. Normal bowel sounds, without guarding, and without rebound.   Msk:  Symmetrical without gross deformities. Normal posture. Pulses:  No carotid, renal, femoral bruits. DP and PT symmetrical and equal Extremities:  Without clubbing or edema. Neurologic:  Alert and  oriented x4;  grossly normal neurologically. Skin:  Intact without significant lesions or rashes. Cervical Nodes:  No significant cervical adenopathy. Psych:  Alert and cooperative. Normal mood and affect.  Intake/Output from previous day: No intake/output data recorded. Intake/Output this shift: No intake/output data recorded.  Lab Results: Recent Labs    12/27/19 0749  WBC 7.7  HGB 11.5*  HCT 37.2*  PLT 178   BMET Recent Labs    12/27/19 0749  NA 134*  K 6.5*  CL 96*  CO2 21*  GLUCOSE 327*  BUN 58*  CREATININE 11.54*  CALCIUM 8.6*   LFT Recent Labs    12/27/19 0749  PROT 7.3  ALBUMIN 3.2*  AST 21  ALT 28  ALKPHOS 118  BILITOT 0.9   PT/INR No results for input(s): LABPROT, INR in the last 72 hours. Hepatitis Panel No results for input(s): HEPBSAG, HCVAB, HEPAIGM, HEPBIGM in the last 72 hours.  Studies/Results: DG Chest Portable 1 View  Result Date: 12/27/2019 CLINICAL DATA:  Follow-up COVID-19 pneumonia. EXAM: PORTABLE CHEST 1 VIEW COMPARISON:  12/09/2019 and earlier. FINDINGS: Cardiac silhouette  markedly enlarged, unchanged. Previously identified patchy ground-glass airspace opacities in the lung bases have resolved. Lungs now clear. Pulmonary vascularity normal without evidence of pulmonary edema. No visible pleural effusions. IMPRESSION: Resolution of the previously identified pneumonia involving the lung bases. No acute cardiopulmonary disease currently.  Electronically Signed   By: Evangeline Dakin M.D.   On: 12/27/2019 08:06    Assessment/Plan:  ESRD-Monday Wednesday Friday dialysis.  [place] dialysis.  Will schedule for his regular dialysis session.  Hyperkalemia will dialyze today use 1K bath  ANEMIA-does not appear to be an issue at this time  MBD-continue binders and vitamin D, 3.5 mcg of Hectorol Monday Wednesday Friday  HTN/VOL-we will aim for fluid removal.  To challenge to euvolemia.  ACCESS-left forearm fistula  Diabetes as per primary team  Shortness of breath with 2D echo pending and VQ scan.  Continue to follow   LOS: 0 Sherril Croon @TODAY @9 :30 AM

## 2019-12-27 NOTE — ED Provider Notes (Signed)
[facility] EMERGENCY DEPARTMENT Provider Note   CSN: [id number] Arrival date & time: 12/27/19  [id number]     History Chief Complaint  Patient presents with  . Shortness of Breath    Jesus Williams is a 59 y.o. male.  HPI 59 year old male presents with shortness of breath.  He states that it started yesterday.  Recently admitted and discharged for the novel coronavirus.  He states it reminds him of that.  A little bit of a residual cough.  No fever.  No significant symptoms at rest.  He is always on 3 L oxygen.  Whenever he walks he feels like his oxygen level is dropping though he has not been able to check it.  Gets short of breath with minimal exertion.  No leg swelling.  When he is getting short of breath he will notice some chest pain.  Last dialysis was 2 days ago and he was due again to go today but when he was there he asked them to send him here for evaluation of shortness of breath.   Past Medical History:  Diagnosis Date  . Chronic kidney disease    chronic  . Gout   . Hyperlipidemia    mixed. Fair control with current meds.  . Hypertension   . OSA (obstructive sleep apnea)   . Severe obesity ([place])    Pt followed by a [facility].    Patient Active Problem List   Diagnosis Date Noted  . ESRD (end stage renal disease) ([place]) 12/11/2019  . Pneumonia due to COVID-19 virus   . SIRS (systemic inflammatory response syndrome) ([place]) 12/07/2019  . Type 2 diabetes mellitus ([place]) 12/07/2019  . COVID-19 12/07/2019  . Acute respiratory failure with hypoxia ([place]) 12/07/2019  . Hyperlipidemia   . Gout   . Hypertension   . Hyponatremia   . Anemia   . Leukopenia   . Acute respiratory distress     Past Surgical History:  Procedure Laterality Date  . AV FISTULA INSERTION W/ RF MAGNETIC GUIDANCE         Family History  Problem Relation Age of Onset  . Hypertension Mother     Social History   Tobacco Use  . Smoking status: Never Smoker  . Smokeless tobacco:  Never Used  Substance Use Topics  . Alcohol use: Not Currently  . Drug use: Not Currently    Home Medications Prior to Admission medications   Medication Sig Start Date End Date Taking? Authorizing Provider  albuterol (VENTOLIN HFA) 108 (90 Base) MCG/ACT inhaler Inhale 2 puffs into the lungs every 6 (six) hours as needed for wheezing or shortness of breath.    [provider]  allopurinol (ZYLOPRIM) 100 MG tablet Take 100 mg by mouth daily.    [provider]  ascorbic acid (VITAMIN C) 500 MG tablet Take 1 tablet (500 mg total) by mouth daily. X 5 days 12/11/19   Orson Eva, MD  aspirin 81 MG chewable tablet Chew 1 tablet by mouth daily. 07/02/16   [provider]  atorvastatin (LIPITOR) 80 MG tablet Take 1 tablet by mouth daily. 07/02/16   [provider]  carvedilol (COREG) 25 MG tablet Take 25 mg by mouth 2 (two) times daily with a meal.    [provider]  dexamethasone (DECADRON) 6 MG tablet Take 1 tablet (6 mg total) by mouth daily. 12/12/19   Orson Eva, MD  insulin aspart protamine- aspart (NOVOLOG MIX 70/30) (70-30) 100 UNIT/ML injection Inject 42 Units  into the skin 2 (two) times daily. 07/02/16   [provider]  sevelamer carbonate (RENVELA) 800 MG tablet Take 800 mg by mouth 3 (three) times daily with meals.    [provider]  zinc sulfate 220 (50 Zn) MG capsule Take 1 capsule (220 mg total) by mouth daily. X 5 days 12/11/19   Orson Eva, MD    Allergies    Patient has no known allergies.  Review of Systems   Review of Systems  Constitutional: Negative for fever.  Respiratory: Positive for cough (residual) and shortness of breath.   Cardiovascular: Positive for chest pain (with exertion). Negative for leg swelling.  All other systems reviewed and are negative.   Physical Exam Updated Vital Signs BP 102/79 (BP Location: Right Arm)   Pulse (!) 108   Temp 97.8 F (36.6 C) (Oral)   Resp 20   Ht 5\' 7"  (1.702 m)    Wt 134.7 kg   BMI 46.52 kg/m   Physical Exam Vitals and nursing note reviewed.  Constitutional:      General: He is not in acute distress.    Appearance: He is well-developed. He is obese. He is not ill-appearing or diaphoretic.  HENT:     Head: Normocephalic and atraumatic.     Right Ear: External ear normal.     Left Ear: External ear normal.     Nose: Nose normal.  Eyes:     General:        Right eye: No discharge.        Left eye: No discharge.  Cardiovascular:     Rate and Rhythm: Normal rate and regular rhythm.     Heart sounds: Normal heart sounds.  Pulmonary:     Effort: Pulmonary effort is normal.     Breath sounds: Normal breath sounds. No decreased breath sounds, wheezing, rhonchi or rales.  Abdominal:     Palpations: Abdomen is soft.     Tenderness: There is no abdominal tenderness.  Musculoskeletal:     Cervical back: Neck supple.     Right lower leg: No edema.     Left lower leg: No edema.  Skin:    General: Skin is warm and dry.  Neurological:     Mental Status: He is alert.  Psychiatric:        Mood and Affect: Mood is not anxious.     ED Results / Procedures / Treatments   Labs (all labs ordered are listed, but only abnormal results are displayed) Labs Reviewed  COMPREHENSIVE METABOLIC PANEL - Abnormal; Notable for the following components:      Result Value   Sodium 134 (*)    Potassium 6.5 (*)    Chloride 96 (*)    CO2 21 (*)    Glucose, Bld 327 (*)    BUN 58 (*)    Creatinine, Ser 11.54 (*)    Calcium 8.6 (*)    Albumin 3.2 (*)    GFR calc non Af Amer 4 (*)    GFR calc Af Amer 5 (*)    Anion gap 17 (*)    All other components within normal limits  D-DIMER, QUANTITATIVE (NOT AT [facility]) - Abnormal; Notable for the following components:   D-Dimer, Quant 0.62 (*)    All other components within normal limits  CBC WITH DIFFERENTIAL/PLATELET - Abnormal; Notable for the following components:   RBC 3.58 (*)    Hemoglobin 11.5 (*)    HCT 37.2  (*)  MCV 103.9 (*)    RDW 16.8 (*)    nRBC 0.3 (*)    Monocytes Absolute 1.1 (*)    All other components within normal limits  RENAL FUNCTION PANEL - Abnormal; Notable for the following components:   Potassium 5.4 (*)    BUN 59 (*)    Creatinine, Ser 12.14 (*)    Phosphorus 5.9 (*)    Albumin 3.2 (*)    GFR calc non Af Amer 4 (*)    GFR calc Af Amer 5 (*)    All other components within normal limits  CBC - Abnormal; Notable for the following components:   RBC 3.49 (*)    Hemoglobin 11.2 (*)    HCT 35.6 (*)    MCV 102.0 (*)    RDW 16.7 (*)    All other components within normal limits  CBG MONITORING, ED - Abnormal; Notable for the following components:   Glucose-Capillary 178 (*)    All other components within normal limits  CBG MONITORING, ED - Abnormal; Notable for the following components:   Glucose-Capillary 151 (*)    All other components within normal limits  TROPONIN I (HIGH SENSITIVITY) - Abnormal; Notable for the following components:   Troponin I (High Sensitivity) 149 (*)    All other components within normal limits  TROPONIN I (HIGH SENSITIVITY) - Abnormal; Notable for the following components:   Troponin I (High Sensitivity) 1,009 (*)    All other components within normal limits  TROPONIN I (HIGH SENSITIVITY) - Abnormal; Notable for the following components:   Troponin I (High Sensitivity) 2,648 (*)    All other components within normal limits  GLUCOSE, CAPILLARY  HEPARIN LEVEL (UNFRACTIONATED)  LIPID PANEL  HEPARIN LEVEL (UNFRACTIONATED)  CBC  MAGNESIUM  COMPREHENSIVE METABOLIC PANEL    EKG EKG Interpretation  Date/Time:  Monday December 27 2019 06:50:43 EST Ventricular Rate:  108 PR Interval:    QRS Duration: 150 QT Interval:  367 QTC Calculation: 492 R Axis:   170 Text Interpretation: Sinus tachycardia Right bundle branch block ST depr, consider ischemia, inferior leads ST depressions in similar distribution to Dec 06 2019 Confirmed by Sherwood Gambler [phone]) on 12/27/2019 7:13:06 AM   Radiology NM Pulmonary Perfusion  Result Date: 12/27/2019 CLINICAL DATA:  Shortness of breath. EXAM: NUCLEAR MEDICINE PERFUSION LUNG SCAN TECHNIQUE: Perfusion images were obtained in multiple projections after intravenous injection of radiopharmaceutical. Ventilation scans intentionally deferred if perfusion scan and chest x-ray adequate for interpretation during COVID 19 epidemic. RADIOPHARMACEUTICALS:  1.7 mCi Tc-8m MAA IV COMPARISON:  December 27, 2019. FINDINGS: No significant perfusion defect is noted. IMPRESSION: No definite perfusion defect is noted. No definite evidence of pulmonary embolus. Electronically Signed   By: Marijo Conception M.D.   On: 12/27/2019 13:23   DG Chest Portable 1 View  Result Date: 12/27/2019 CLINICAL DATA:  Follow-up COVID-19 pneumonia. EXAM: PORTABLE CHEST 1 VIEW COMPARISON:  12/09/2019 and earlier. FINDINGS: Cardiac silhouette markedly enlarged, unchanged. Previously identified patchy ground-glass airspace opacities in the lung bases have resolved. Lungs now clear. Pulmonary vascularity normal without evidence of pulmonary edema. No visible pleural effusions. IMPRESSION: Resolution of the previously identified pneumonia involving the lung bases. No acute cardiopulmonary disease currently. Electronically Signed   By: Evangeline Dakin M.D.   On: 12/27/2019 08:06   ECHOCARDIOGRAM LIMITED  Result Date: 12/27/2019   ECHOCARDIOGRAM LIMITED REPORT   Patient Name:   JOURDAIN VIELMAN [facility] Date of Exam: 12/27/2019 Medical Rec #:  [id number]  Height:       67.0 in Accession #:    [id number]         Weight:       297.0 lb Date of Birth:  06/22/1961          BSA:          2.39 m Patient Age:    44 years           BP:           136/91 mmHg Patient Gender: M                  HR:           105 bpm. Exam Location:  [facility]  Procedure: 2D Echo, Cardiac Doppler and Color Doppler Indications:    Chest Pain  History:        Patient has no prior  history of Echocardiogram examinations.                 ESRD, Covid,Acute respiratory distress,Morbid obesity.  Sonographer:    Alvino Chapel RCS Referring Phys: [id number] [place]  1. Left ventricular ejection fraction, by visual estimation, is 55 to 60%. The left ventricle has normal function. Unable to assess regional wall motion.  2. Global right ventricle has normal systolc function.The right ventricular size is mildly enlarged. Right ventricular wall thickness was not assessed.  3. Mild mitral annular calcification.  4. The mitral valve is grossly normal. Trivial mitral valve regurgitation.  5. No evidence of aortic valve sclerosis or stenosis.  6. The inferior vena cava is dilated in size with <50% respiratory variability, suggesting right atrial pressure of 15 mmHg.  7. Left ventricular diastolic parameters are consistent with Grade I diastolic dysfunction (impaired relaxation). FINDINGS  Left Ventricle: Left ventricular ejection fraction, by visual estimation, is 55 to 60%. The left ventricle has normal function. The left ventricular internal cavity size was the left ventricle is normal in size. There is moderately increased left ventricular wall thickness. Concentric left ventricular hypertrophy. Left ventricular diastolic parameters are consistent with Grade I diastolic dysfunction (impaired relaxation). Right Ventricle: The right ventricular size is mildly enlarged. Right vetricular wall thickness was not assessed. Global RV systolic function is has normal systolic function. Left Atrium: Left atrial size was normal in size. Right Atrium: Right atrial size was not well visualized. Right atrial pressure is estimated at 15 mmHg. Pericardium: There is no evidence of pericardial effusion is seen. There is no evidence of pericardial effusion. Mitral Valve: The mitral valve is grossly normal. Mild mitral annular calcification. MV Area by PHT, 3.02 cm. MV PHT, 72.93 msec. Trivial mitral valve  regurgitation. Tricuspid Valve: The tricuspid valve is not well visualized. Aortic Valve: The aortic valve is structurally normal, with no evidence of sclerosis or stenosis. Aorta: The aortic root is normal in size and structure. Venous: The inferior vena cava is dilated in size with less than 50% respiratory variability, suggesting right atrial pressure of 15 mmHg.  LEFT VENTRICLE          Normals PLAX 2D LVIDd:         4.85 cm  3.6 cm   Diastology                  Normals LVIDs:         3.36 cm  1.7 cm   LV e' lateral:   10.30 cm/s 6.42 cm/s LV PW:  1.43 cm  1.4 cm   LV E/e' lateral: 5.4        15.4 LV IVS:        1.42 cm  1.3 cm   LV e' medial:    5.87 cm/s  6.96 cm/s LVOT diam:     1.90 cm  2.0 cm   LV E/e' medial:  9.5        6.96 LV SV:         64 ml    79 ml LV SV Index:   24.66    45 ml/m2 LVOT Area:     2.84 cm 3.14 cm2  RIGHT VENTRICLE RV S prime:     11.10 cm/s TAPSE (M-mode): 1.1 cm LEFT ATRIUM         Index LA diam:    2.90 cm 1.21 cm/m   AORTA                 Normals Ao Root diam: 3.05 cm 31 mm MITRAL VALVE              Normals MV Area (PHT): 3.02 cm             SHUNTS MV PHT:        72.93 msec 55 ms     Systemic Diam: 1.90 cm MV Decel Time: 252 msec   187 ms MV E velocity: 55.55 cm/s 103 cm/s MV A velocity: 66.70 cm/s 70.3 cm/s MV E/A ratio:  0.83       1.5  Kate Sable MD Electronically signed by Kate Sable MD Signature Date/Time: 12/27/2019/3:23:43 PMThe mitral valve is grossly normal.    Final     Procedures Procedures (including critical care time)  Medications Ordered in ED Medications  Chlorhexidine Gluconate Cloth 2 % PADS 6 each (has no administration in time range)  pentafluoroprop-tetrafluoroeth (GEBAUERS) aerosol 1 application (has no administration in time range)  lidocaine (PF) (XYLOCAINE) 1 % injection 5 mL (has no administration in time range)  lidocaine-prilocaine (EMLA) cream 1 application (has no administration in time range)  heparin injection 1,000  Units (has no administration in time range)  alteplase (CATHFLO ACTIVASE) injection 2 mg (has no administration in time range)  heparin bolus via infusion 4,000 Units (4,000 Units Intravenous Bolus from Bag 12/27/19 1246)    Followed by  heparin ADULT infusion 100 units/mL (25000 units/264mL sodium chloride 0.45%) (1,200 Units/hr Intravenous New Bag/Given 12/27/19 1251)  allopurinol (ZYLOPRIM) tablet 100 mg (has no administration in time range)  aspirin chewable tablet 81 mg (has no administration in time range)  atorvastatin (LIPITOR) tablet 80 mg (has no administration in time range)  carvedilol (COREG) tablet 25 mg (has no administration in time range)  sevelamer carbonate (RENVELA) tablet 800 mg (has no administration in time range)  sodium chloride flush (NS) 0.9 % injection 3 mL (has no administration in time range)  sodium chloride flush (NS) 0.9 % injection 3 mL (has no administration in time range)  0.9 %  sodium chloride infusion (has no administration in time range)  acetaminophen (TYLENOL) tablet 650 mg (has no administration in time range)    Or  acetaminophen (TYLENOL) suppository 650 mg (has no administration in time range)  ondansetron (ZOFRAN) tablet 4 mg (has no administration in time range)    Or  ondansetron (ZOFRAN) injection 4 mg (has no administration in time range)  insulin aspart (novoLOG) injection 0-6 Units (has no administration in time range)  insulin aspart (novoLOG) injection 0-5  Units (has no administration in time range)  insulin aspart (novoLOG) injection 2 Units (has no administration in time range)  insulin aspart (novoLOG) injection 5 Units (5 Units Intravenous Given 12/27/19 0900)  calcium gluconate 1 g/ 50 mL sodium chloride IVPB (0 mg Intravenous Stopped 12/27/19 1022)  albuterol (VENTOLIN HFA) 108 (90 Base) MCG/ACT inhaler 8 puff (8 puffs Inhalation Given 12/27/19 0853)  technetium albumin aggregated (MAA) injection solution 1.5 millicurie (1.7 millicuries  Intravenous Contrast Given 12/27/19 1200)    ED Course  I have reviewed the triage vital signs and the nursing notes.  Pertinent labs & imaging results that were available during my care of the patient were reviewed by me and considered in my medical decision making (see chart for details).    MDM Rules/Calculators/A&P                      Patient presents with exertional dyspnea.  He has chest pain while he walks as well.  ECG shows slightly more depressed ST segments though overall in similar distribution.  He also has noted to have hyperkalemia though he has not really missed any dialysis (was supposed to go today but has not yet).  Discussed with Dr. Justin Mend who will help dialyze.  Discussed with Dr. Manuella Ghazi admission. D/w Dr. Bronson Ing, cards, who will consult.  At this time he recommends trending troponins.  No IV heparin at this time.  Of note, when patient left ED, second troponin had not yet resulted. Final Clinical Impression(s) / ED Diagnoses Final diagnoses:  Exertional dyspnea    Rx / DC Orders ED Discharge Orders    None       Sherwood Gambler, MD 12/27/19 1737

## 2019-12-27 NOTE — H&P (Signed)
History and Physical    Jesus Williams [id number] DOB: Aug 29, 1961 DOA: 12/27/2019  PCP: System, Provider Not In   Patient coming from: Home  Chief Complaint: Dyspnea and chest tightness with exertion  HPI: Jesus Williams is a 59 y.o. male with medical history significant for morbid obesity, OSA-intolerant of CPAP, ESRD on HD, gout, type 2 diabetes, essential hypertension, dyslipidemia, chronic hyponatremia, anemia, and recent discharge on 12/11/2019 after admission for acute on chronic hypoxemic respiratory failure secondary to Covid pneumonia.  He states that ever since he was discharged he has been having some progressive shortness of breath, but this acutely worsened yesterday.  He states in particular that his symptoms worsen with exertion and improved with rest.  He is also noted to have some substernal chest tightness without any radiation when this occurs.  He still maintains a residual cough from his recent Covid diagnosis that is nonproductive.  He feels as though his oxygen levels dropped when he ambulates.  He denies any leg swelling, palpitations, orthopnea, lightheaded, dizziness, fevers, or chills.  His last hemodialysis session was 2 days ago and he was due to go back today, but was sent to the ED for further evaluation given his symptoms since then.  He denies any current chest pain or shortness of breath.   ED Course: Vital signs stable with heart rate slightly elevated at 102 bpm.  He is afebrile.  Laboratory data with potassium noted to be 6.5 and BUN 58 and creatinine 11.5 with glucose 327.  His troponin was initially minimally elevated at 149, but repeat is 1009.  Chest x-ray with no acute findings, but rather findings of Covid resolution.  His D-dimer is mildly elevated at 0.6 and a VQ scan has been ordered for further evaluation.  He has been given calcium gluconate as well as IV insulin to assist with his potassium levels and has been seen by nephrology with plans for  hemodialysis today.  He has also been seen by cardiology with plans for IV heparin and further evaluation with 2D echocardiogram.  Review of Systems: All others reviewed as noted above and otherwise negative.  Past Medical History:  Diagnosis Date  . Chronic respiratory failure ([place])    a. diagnosed with COPD, started on O2 in 2016.  . Diabetes mellitus ([place])    a. a1c 7.0 on 11/2019.  Marland Kitchen ESRD on hemodialysis ([place])   . Gout   . Hyperlipidemia    mixed. Fair control with current meds.  . Hypertension   . Mild anemia   . Morbid obesity ([place])    Pt followed by a [facility] Clinic.  Marland Kitchen OSA (obstructive sleep apnea)   . Pneumonia due to COVID-19 virus    Admitted 11/2019-12/2019    Past Surgical History:  Procedure Laterality Date  . AV FISTULA INSERTION W/ RF MAGNETIC GUIDANCE       reports that he has never smoked. He has never used smokeless tobacco. He reports previous alcohol use. He reports that he does not use drugs.  Allergies  Allergen Reactions  . No Known Allergies     Family History  Problem Relation Age of Onset  . Hypertension Mother     Prior to Admission medications   Medication Sig Start Date End Date Taking? Authorizing Provider  albuterol (VENTOLIN HFA) 108 (90 Base) MCG/ACT inhaler Inhale 2 puffs into the lungs every 6 (six) hours as needed for wheezing or shortness of breath.   Yes [provider]  allopurinol (ZYLOPRIM)  100 MG tablet Take 100 mg by mouth daily.   Yes [provider]  ascorbic acid (VITAMIN C) 500 MG tablet Take 1 tablet (500 mg total) by mouth daily. X 5 days 12/11/19  Yes Tat, Shanon Brow, MD  aspirin 81 MG chewable tablet Chew 1 tablet by mouth daily. 07/02/16  Yes [provider]  atorvastatin (LIPITOR) 80 MG tablet Take 1 tablet by mouth daily. 07/02/16  Yes [provider]  carvedilol (COREG) 25 MG tablet Take 25 mg by mouth 2 (two) times daily with a meal.   Yes [provider]  insulin aspart  protamine- aspart (NOVOLOG MIX 70/30) (70-30) 100 UNIT/ML injection Inject 42 Units into the skin 2 (two) times daily. 07/02/16  Yes [provider]  sevelamer carbonate (RENVELA) 800 MG tablet Take 800 mg by mouth 3 (three) times daily with meals.   Yes [provider]  zinc sulfate 220 (50 Zn) MG capsule Take 1 capsule (220 mg total) by mouth daily. X 5 days 12/11/19  Yes Tat, Shanon Brow, MD  dexamethasone (DECADRON) 6 MG tablet Take 1 tablet (6 mg total) by mouth daily. Patient not taking: Reported on 12/27/2019 12/12/19   Orson Eva, MD    Physical Exam: Vitals:   12/27/19 0945 12/27/19 1015 12/27/19 1045 12/27/19 1107  BP:    (!) 136/91  Pulse: 96 (!) 104 (!) 106 (!) 105  Resp: 14 18 18 20   Temp:    99 F (37.2 C)  TempSrc:    Oral  SpO2: 100% 99% 92% 99%  Weight:      Height:        Constitutional: NAD, calm, comfortable Vitals:   12/27/19 0945 12/27/19 1015 12/27/19 1045 12/27/19 1107  BP:    (!) 136/91  Pulse: 96 (!) 104 (!) 106 (!) 105  Resp: 14 18 18 20   Temp:    99 F (37.2 C)  TempSrc:    Oral  SpO2: 100% 99% 92% 99%  Weight:      Height:       Eyes: lids and conjunctivae normal ENMT: Mucous membranes are moist.  Neck: normal, supple Respiratory: clear to auscultation bilaterally. Normal respiratory effort. No accessory muscle use.  Currently on 2 L nasal cannula oxygen. Cardiovascular: Regular rate and rhythm, no murmurs.  Mildly tachycardic.  No extremity edema. Abdomen: no tenderness, no distention. Bowel sounds positive.  Musculoskeletal:  No joint deformity upper and lower extremities.   Skin: no rashes, lesions, ulcers.  Psychiatric: Normal judgment and insight. Alert and oriented x 3. Normal mood.   Labs on Admission: I have personally reviewed following labs and imaging studies  CBC: Recent Labs  Lab 12/27/19 0749 12/27/19 1152  WBC 7.7 8.5  NEUTROABS 5.4  --   HGB 11.5* 11.2*  HCT 37.2* 35.6*  MCV 103.9* 102.0*  PLT 178 0000000   Basic  Metabolic Panel: Recent Labs  Lab 12/27/19 0749 12/27/19 1152  NA 134* 138  K 6.5* 5.4*  CL 96* 100  CO2 21* 23  GLUCOSE 327* 96  BUN 58* 59*  CREATININE 11.54* 12.14*  CALCIUM 8.6* 8.9  PHOS  --  5.9*   GFR: Estimated Creatinine Clearance: 9 mL/min (A) (by C-G formula based on SCr of 12.14 mg/dL (H)). Liver Function Tests: Recent Labs  Lab 12/27/19 0749 12/27/19 1152  AST 21  --   ALT 28  --   ALKPHOS 118  --   BILITOT 0.9  --   PROT 7.3  --  ALBUMIN 3.2* 3.2*   No results for input(s): LIPASE, AMYLASE in the last 168 hours. No results for input(s): AMMONIA in the last 168 hours. Coagulation Profile: No results for input(s): INR, PROTIME in the last 168 hours. Cardiac Enzymes: No results for input(s): CKTOTAL, CKMB, CKMBINDEX, TROPONINI in the last 168 hours. BNP (last 3 results) No results for input(s): PROBNP in the last 8760 hours. HbA1C: No results for input(s): HGBA1C in the last 72 hours. CBG: Recent Labs  Lab 12/27/19 0859 12/27/19 0932  GLUCAP 178* 151*   Lipid Profile: No results for input(s): CHOL, HDL, LDLCALC, TRIG, CHOLHDL, LDLDIRECT in the last 72 hours. Thyroid Function Tests: No results for input(s): TSH, T4TOTAL, FREET4, T3FREE, THYROIDAB in the last 72 hours. Anemia Panel: No results for input(s): VITAMINB12, FOLATE, FERRITIN, TIBC, IRON, RETICCTPCT in the last 72 hours. Urine analysis: No results found for: COLORURINE, APPEARANCEUR, LABSPEC, PHURINE, GLUCOSEU, HGBUR, BILIRUBINUR, KETONESUR, PROTEINUR, UROBILINOGEN, NITRITE, LEUKOCYTESUR  Radiological Exams on Admission: DG Chest Portable 1 View  Result Date: 12/27/2019 CLINICAL DATA:  Follow-up COVID-19 pneumonia. EXAM: PORTABLE CHEST 1 VIEW COMPARISON:  12/09/2019 and earlier. FINDINGS: Cardiac silhouette markedly enlarged, unchanged. Previously identified patchy ground-glass airspace opacities in the lung bases have resolved. Lungs now clear. Pulmonary vascularity normal without  evidence of pulmonary edema. No visible pleural effusions. IMPRESSION: Resolution of the previously identified pneumonia involving the lung bases. No acute cardiopulmonary disease currently. Electronically Signed   By: Evangeline Dakin M.D.   On: 12/27/2019 08:06    EKG: Independently reviewed.  Sinus tachycardia with heart rate 108 bpm.  Pronounced ST depression in lateral leads compared to prior.  Assessment/Plan Active Problems:   Dyspnea    Elevated troponin with likely NSTEMI -Currently asymptomatic, but significant troponin elevation and EKG changes noted -Appreciate cardiology recommendations -Start IV heparin drip -Monitor on telemetry with 2D echocardiogram ordered and pending -Continue aspirin, statin, and Coreg -Continue to trend troponins -May require transfer to [facility] for cardiac catheterization pending further studies  Elevated D-dimer -Low suspicion for PE -VQ scan ordered and pending by EDP  Hyperkalemia in the setting of ESRD on HD -Given insulin and calcium gluconate with improvements noted on repeat labs -Monitor in telemetry -Repeat a.m. labs -Appreciate nephrology with plans for hemodialysis today  Recent Covid pneumonia with chronic hypoxemia  -Continue monitor while on isolation precautions -No acute symptoms related to this currently  Type 2 diabetes -Noted hyperglycemia that has improved after IV insulin administration -Keep on sliding scale insulin while here  Essential hypertension -Continue on Coreg and monitor  Dyslipidemia -Continue statin  Chronic gout -No acute symptoms noted -Continue allopurinol  Morbid obesity -Lifestyle changes  OSA -Intolerant of CPAP  DVT prophylaxis: Heparin drip Code Status: Full code Family Communication: None at bedside Disposition Plan: Admit for ACS evaluation and hemodialysis with noted hyperkalemia Consults called: Cardiology and nephrology Admission status: Inpatient, telemetry   Twisha Vanpelt Darleen Crocker DO [facility] Pager [phone]  If 7PM-7AM, please contact night-coverage [email or web address] Password [email or web address]  12/27/2019, 1:00 PM

## 2019-12-27 NOTE — ED Notes (Signed)
Repositioned the pts pulse ox probe various times, with difficulty obtaining an O2 sat, pt breathing not labored at this time, pt RR WNL, pt speaks in full sentences, Regenia Skeeter, MD aware, will continue to monitor

## 2019-12-28 ENCOUNTER — Encounter (HOSPITAL_COMMUNITY)
Admission: EM | Disposition: A | Discharge status: Home / Self Care | Payer: Self-pay | Source: Home / Self Care | Attending: Family Medicine

## 2019-12-28 DIAGNOSIS — R778 Other specified abnormalities of plasma proteins: Secondary | ICD-10-CM

## 2019-12-28 DIAGNOSIS — I214 Non-ST elevation (NSTEMI) myocardial infarction: Secondary | ICD-10-CM | POA: Diagnosis present

## 2019-12-28 DIAGNOSIS — G4733 Obstructive sleep apnea (adult) (pediatric): Secondary | ICD-10-CM | POA: Diagnosis present

## 2019-12-28 HISTORY — PX: LEFT HEART CATH AND CORONARY ANGIOGRAPHY: CATH118249

## 2019-12-28 LAB — COMPREHENSIVE METABOLIC PANEL
ALT: 33 U/L (ref 0–44)
AST: 33 U/L (ref 15–41)
Albumin: 3.4 g/dL — ABNORMAL LOW (ref 3.5–5.0)
Alkaline Phosphatase: 94 U/L (ref 38–126)
Anion gap: 17 — ABNORMAL HIGH (ref 5–15)
BUN: 43 mg/dL — ABNORMAL HIGH (ref 6–20)
CO2: 24 mmol/L (ref 22–32)
Calcium: 8.9 mg/dL (ref 8.9–10.3)
Chloride: 93 mmol/L — ABNORMAL LOW (ref 98–111)
Creatinine, Ser: 9.71 mg/dL — ABNORMAL HIGH (ref 0.61–1.24)
GFR calc Af Amer: 6 mL/min — ABNORMAL LOW (ref >60)
GFR calc non Af Amer: 5 mL/min — ABNORMAL LOW (ref >60)
Glucose, Bld: 147 mg/dL — ABNORMAL HIGH (ref 70–99)
Potassium: 5.4 mmol/L — ABNORMAL HIGH (ref 3.5–5.1)
Sodium: 134 mmol/L — ABNORMAL LOW (ref 135–145)
Total Bilirubin: 0.8 mg/dL (ref 0.3–1.2)
Total Protein: 7.7 g/dL (ref 6.5–8.1)

## 2019-12-28 LAB — LIPID PANEL
Cholesterol: 84 mg/dL (ref 0–200)
HDL: 32 mg/dL — ABNORMAL LOW (ref >40)
LDL Cholesterol: 32 mg/dL (ref 0–99)
Total CHOL/HDL Ratio: 2.6 RATIO
Triglycerides: 98 mg/dL (ref <150)
VLDL: 20 mg/dL (ref 0–40)

## 2019-12-28 LAB — HEPARIN LEVEL (UNFRACTIONATED): Heparin Unfractionated: 0.24 IU/mL — ABNORMAL LOW (ref 0.30–0.70)

## 2019-12-28 LAB — CBC
HCT: 35.8 % — ABNORMAL LOW (ref 39.0–52.0)
Hemoglobin: 11.2 g/dL — ABNORMAL LOW (ref 13.0–17.0)
MCH: 31.8 pg (ref 26.0–34.0)
MCHC: 31.3 g/dL (ref 30.0–36.0)
MCV: 101.7 fL — ABNORMAL HIGH (ref 80.0–100.0)
Platelets: 181 10*3/uL (ref 150–400)
RBC: 3.52 MIL/uL — ABNORMAL LOW (ref 4.22–5.81)
RDW: 16.7 % — ABNORMAL HIGH (ref 11.5–15.5)
WBC: 6.6 10*3/uL (ref 4.0–10.5)
nRBC: 0.3 % — ABNORMAL HIGH (ref 0.0–0.2)

## 2019-12-28 LAB — TROPONIN I (HIGH SENSITIVITY)
Troponin I (High Sensitivity): 5381 ng/L (ref <18)
Troponin I (High Sensitivity): 6317 ng/L (ref <18)

## 2019-12-28 LAB — GLUCOSE, CAPILLARY
Glucose-Capillary: 118 mg/dL — ABNORMAL HIGH (ref 70–99)
Glucose-Capillary: 125 mg/dL — ABNORMAL HIGH (ref 70–99)
Glucose-Capillary: 94 mg/dL (ref 70–99)
Glucose-Capillary: 178 mg/dL — ABNORMAL HIGH (ref 70–99)

## 2019-12-28 LAB — MAGNESIUM: Magnesium: 1.7 mg/dL (ref 1.7–2.4)

## 2019-12-28 SURGERY — LEFT HEART CATH AND CORONARY ANGIOGRAPHY
Anesthesia: LOCAL

## 2019-12-28 MED ORDER — LABETALOL HCL 5 MG/ML IV SOLN: 10.0000 mg | INTRAVENOUS | Status: AC | PRN

## 2019-12-28 MED ORDER — SODIUM CHLORIDE 0.9% FLUSH: 3.0000 mL | INTRAVENOUS | Status: DC | PRN

## 2019-12-28 MED ORDER — VERAPAMIL HCL 2.5 MG/ML IV SOLN
INTRAVENOUS | Status: AC
  Filled 2019-12-28: qty 2

## 2019-12-28 MED ORDER — CHLORHEXIDINE GLUCONATE CLOTH 2 % EX PADS
6.0000 | MEDICATED_PAD | Freq: Every day | CUTANEOUS | Status: DC
  Administered 2019-12-29: 05:00:00 6 via TOPICAL

## 2019-12-28 MED ORDER — LIDOCAINE HCL (PF) 1 % IJ SOLN
INTRAMUSCULAR | Status: AC
  Filled 2019-12-28: qty 30

## 2019-12-28 MED ORDER — ACETAMINOPHEN 325 MG PO TABS: 650.0000 mg | ORAL_TABLET | ORAL | Status: DC | PRN

## 2019-12-28 MED ORDER — MIDAZOLAM HCL 2 MG/2ML IJ SOLN
INTRAMUSCULAR | Status: DC | PRN
  Administered 2019-12-28: 1 mg via INTRAVENOUS

## 2019-12-28 MED ORDER — FENTANYL CITRATE (PF) 100 MCG/2ML IJ SOLN
INTRAMUSCULAR | Status: AC
  Filled 2019-12-28: qty 2

## 2019-12-28 MED ORDER — SODIUM CHLORIDE 0.9 % IV SOLN: 250.0000 mL | INTRAVENOUS | Status: DC | PRN

## 2019-12-28 MED ORDER — LIDOCAINE HCL (PF) 1 % IJ SOLN
INTRAMUSCULAR | Status: DC | PRN
  Administered 2019-12-28: 20 mL

## 2019-12-28 MED ORDER — HEPARIN (PORCINE) IN NACL 1000-0.9 UT/500ML-% IV SOLN
INTRAVENOUS | Status: AC
  Filled 2019-12-28: qty 1000

## 2019-12-28 MED ORDER — FENTANYL CITRATE (PF) 100 MCG/2ML IJ SOLN
INTRAMUSCULAR | Status: DC | PRN
  Administered 2019-12-28: 25 ug via INTRAVENOUS

## 2019-12-28 MED ORDER — HEPARIN SODIUM (PORCINE) 1000 UNIT/ML IJ SOLN
INTRAMUSCULAR | Status: AC
  Filled 2019-12-28: qty 1

## 2019-12-28 MED ORDER — HEPARIN (PORCINE) IN NACL 1000-0.9 UT/500ML-% IV SOLN
INTRAVENOUS | Status: DC | PRN
  Administered 2019-12-28 (×2): 500 mL

## 2019-12-28 MED ORDER — HYDRALAZINE HCL 20 MG/ML IJ SOLN: 10.0000 mg | INTRAMUSCULAR | Status: AC | PRN

## 2019-12-28 MED ORDER — SODIUM CHLORIDE 0.9% FLUSH
3.0000 mL | Freq: Two times a day (BID) | INTRAVENOUS | Status: DC
  Administered 2019-12-29: 3 mL via INTRAVENOUS

## 2019-12-28 MED ORDER — MIDAZOLAM HCL 2 MG/2ML IJ SOLN
INTRAMUSCULAR | Status: AC
  Filled 2019-12-28: qty 2

## 2019-12-28 MED ORDER — SODIUM ZIRCONIUM CYCLOSILICATE 10 G PO PACK
10.0000 g | PACK | Freq: Once | ORAL | Status: AC
  Administered 2019-12-28: 10 g via ORAL
  Filled 2019-12-28: qty 1

## 2019-12-28 MED ORDER — ONDANSETRON HCL 4 MG/2ML IJ SOLN: 4.0000 mg | Freq: Four times a day (QID) | INTRAMUSCULAR | Status: DC | PRN

## 2019-12-28 MED ORDER — IOHEXOL 350 MG/ML SOLN
INTRAVENOUS | Status: DC | PRN
  Administered 2019-12-28: 55 mL

## 2019-12-28 MED ORDER — SODIUM CHLORIDE 0.9% FLUSH: 3.0000 mL | Freq: Two times a day (BID) | INTRAVENOUS | Status: DC

## 2019-12-28 MED ORDER — HEPARIN BOLUS VIA INFUSION
1500.0000 [IU] | Freq: Once | INTRAVENOUS | Status: AC
  Administered 2019-12-28: 1500 [IU] via INTRAVENOUS
  Filled 2019-12-28: qty 1500

## 2019-12-28 MED ORDER — SODIUM ZIRCONIUM CYCLOSILICATE 10 G PO PACK
10.0000 g | PACK | Freq: Three times a day (TID) | ORAL | Status: DC
  Administered 2019-12-28: 21:00:00 10 g via ORAL
  Filled 2019-12-28 (×2): qty 1

## 2019-12-28 MED ORDER — SODIUM CHLORIDE 0.9 % IV SOLN: INTRAVENOUS | Status: DC

## 2019-12-28 SURGICAL SUPPLY — 9 items
CATH INFINITI 5FR MULTPACK ANG (CATHETERS) ×1 IMPLANT
CLOSURE MYNX CONTROL 5F (Vascular Products) ×1 IMPLANT
KIT HEART LEFT (KITS) ×2 IMPLANT
PACK CARDIAC CATHETERIZATION (CUSTOM PROCEDURE TRAY) ×2 IMPLANT
SHEATH PINNACLE 5F 10CM (SHEATH) ×1 IMPLANT
SHEATH PROBE COVER 6X72 (BAG) ×1 IMPLANT
TRANSDUCER W/STOPCOCK (MISCELLANEOUS) ×2 IMPLANT
TUBING CIL FLEX 10 FLL-RA (TUBING) ×2 IMPLANT
WIRE EMERALD 3MM-J .035X150CM (WIRE) ×1 IMPLANT

## 2019-12-28 NOTE — Interval H&P Note (Signed)
Cath Lab Visit (complete for each Cath Lab visit)  Clinical Evaluation Leading to the Procedure:   ACS: Yes.    Non-ACS:    Anginal Classification: CCS IV  Anti-ischemic medical therapy: Minimal Therapy (1 class of medications)  Non-Invasive Test Results: No non-invasive testing performed  Prior CABG: No previous CABG      History and Physical Interval Note:  12/28/2019 3:16 PM  Rod Mae  has presented today for surgery, with the diagnosis of chest pain.  The various methods of treatment have been discussed with the patient and family. After consideration of risks, benefits and other options for treatment, the patient has consented to  Procedure(s): LEFT HEART CATH AND CORONARY ANGIOGRAPHY (N/A) as a surgical intervention.  The patient's history has been reviewed, patient examined, no change in status, stable for surgery.  I have reviewed the patient's chart and labs.  Questions were answered to the patient's satisfaction.     Larae Grooms

## 2019-12-28 NOTE — Progress Notes (Addendum)
ANTICOAGULATION CONSULT NOTE -   Pharmacy Consult for heparin Indication: chest pain/ACS  Allergies  Allergen Reactions  . No Known Allergies     Patient Measurements: Height: 5\' 7"  (170.2 cm) Weight: 295 lb 13.7 oz (134.2 kg) IBW/kg (Calculated) : 66.1 HEPARIN DW (KG): 98.3  Vital Signs: Temp: 100.2 F (37.9 C) (01/19 0416) Temp Source: Oral (01/19 0416) BP: 113/64 (01/19 0416) Pulse Rate: 102 (01/19 0416)  Labs: Recent Labs    12/27/19 0749 12/27/19 1034 12/27/19 1152 12/27/19 1513 12/27/19 1810 12/27/19 1933 12/27/19 2229 12/28/19 0136 12/28/19 0505  HGB 11.5*   < > 11.2*  --   --   --   --   --  11.2*  HCT 37.2*  --  35.6*  --   --   --   --   --  35.8*  PLT 178  --  188  --   --   --   --   --  181  HEPARINUNFRC  --   --   --   --  0.29*  --   --   --  0.24*  CREATININE 11.54*  --  12.14*  --   --   --   --   --  9.71*  TROPONINIHS 149*   < >  --    < >  --    < > 4,663* 5,381* 6,317*   < > = values in this interval not displayed.    Estimated Creatinine Clearance: 11.2 mL/min (A) (by C-G formula based on SCr of 9.71 mg/dL (H)).   Medical History: Past Medical History:  Diagnosis Date  . Chronic respiratory failure ([place])    a. diagnosed with COPD, started on O2 in 2016.  . Diabetes mellitus ([place])    a. a1c 7.0 on 11/2019.  Marland Kitchen ESRD on hemodialysis ([place])   . Gout   . Hyperlipidemia    mixed. Fair control with current meds.  . Hypertension   . Mild anemia   . Morbid obesity ([place])    Pt followed by a [facility] Clinic.  Marland Kitchen OSA (obstructive sleep apnea)   . Pneumonia due to COVID-19 virus    Admitted 11/2019-12/2019    Medications:  Medications Prior to Admission  Medication Sig Dispense Refill Last Dose  . albuterol (VENTOLIN HFA) 108 (90 Base) MCG/ACT inhaler Inhale 2 puffs into the lungs every 6 (six) hours as needed for wheezing or shortness of breath.   12/26/2019 at Unknown time  . allopurinol (ZYLOPRIM) 100 MG tablet Take 100 mg by mouth daily.    12/27/2019 at Unknown time  . ascorbic acid (VITAMIN C) 500 MG tablet Take 1 tablet (500 mg total) by mouth daily. X 5 days   12/26/2019 at Unknown time  . aspirin 81 MG chewable tablet Chew 1 tablet by mouth daily.   12/26/2019 at 1730  . atorvastatin (LIPITOR) 80 MG tablet Take 1 tablet by mouth daily.   12/26/2019 at Unknown time  . carvedilol (COREG) 25 MG tablet Take 25 mg by mouth 2 (two) times daily with a meal.   12/26/2019 at 1730  . insulin aspart protamine- aspart (NOVOLOG MIX 70/30) (70-30) 100 UNIT/ML injection Inject 42 Units into the skin 2 (two) times daily.   12/27/2019 at Unknown time  . sevelamer carbonate (RENVELA) 800 MG tablet Take 800 mg by mouth 3 (three) times daily with meals.   12/27/2019 at Unknown time  . zinc sulfate 220 (50 Zn) MG capsule Take 1 capsule (  220 mg total) by mouth daily. X 5 days   12/26/2019 at Unknown time  . dexamethasone (DECADRON) 6 MG tablet Take 1 tablet (6 mg total) by mouth daily. (Patient not taking: Reported on 12/27/2019) 5 tablet 0 Not Taking at Unknown time    Assessment: 59 year old male with a recent history of COVID-19 pneumonia, morbid obesity, obstructive sleep apnea, end-stage renal disease on hemodialysis, and chronic hypoxic respiratory failure on home oxygen. He has recently been experiencing hypoxemic episodes with exertion.  He had some mild chest tightness with exertion but denied chest pain per se.D-dimer of 0.62, high-sensitivity troponins of 149. IV heparin  To start until VQ scan, ECHO , and additional troponins available. Pharmacy asked to dose heparin.  HL 0.29>> 0.24, subtherapeutic . Reviewed with RN and no problems or interruptions with infusion  Goal of Therapy:  Heparin level 0.3-0.7 units/ml Monitor platelets by anticoagulation protocol: Yes   Plan:  Bolus 1500 units and increase heparin infusion to 1500 units/hr Check anti-Xa level in ~ 6 hours and daily while on heparin Continue to monitor H&H and platelets  Isac Sarna, BS Vena [place], BCPS Clinical Pharmacist Pager [phone] 12/28/2019,8:35 AM

## 2019-12-28 NOTE — Progress Notes (Signed)
Progress Note  Patient Name: Jesus Williams Date of Encounter: 12/28/2019  Primary Cardiologist: Kate Sable, MD   Due to the COVID-19 pandemic, the visit was completed with telemedicine (audio) technology to reduce patient and provider exposure as well as to preserve personal protective equipment.   Subjective   No chest pain or shortness of breath at rest. Wondering when he'll be able to eat.  Inpatient Medications    Scheduled Meds: . allopurinol  100 mg Oral Daily  . aspirin  81 mg Oral Daily  . atorvastatin  80 mg Oral Daily  . carvedilol  25 mg Oral BID WC  . Chlorhexidine Gluconate Cloth  6 each Topical Q0600  . heparin  1,500 Units Intravenous Once  . insulin aspart  0-5 Units Subcutaneous QHS  . insulin aspart  0-6 Units Subcutaneous TID WC  . insulin aspart  2 Units Subcutaneous TID WC  . loratadine  5 mg Oral Daily  . sevelamer carbonate  800 mg Oral TID WC  . sodium chloride flush  3 mL Intravenous Q12H  . sodium zirconium cyclosilicate  10 g Oral Once   Continuous Infusions: . sodium chloride    . heparin 1,300 Units/hr (12/28/19 0253)   PRN Meds: sodium chloride, acetaminophen **OR** acetaminophen, alteplase, heparin, lidocaine (PF), lidocaine-prilocaine, ondansetron **OR** ondansetron (ZOFRAN) IV, pentafluoroprop-tetrafluoroeth, sodium chloride flush   Vital Signs    Vitals:   12/27/19 1706 12/27/19 2119 12/28/19 0100 12/28/19 0416  BP: 104/64 (!) 107/57 (!) 124/93 113/64  Pulse: (!) 110 (!) 103  (!) 102  Resp:  20  20  Temp:  99.8 F (37.7 C)  100.2 F (37.9 C)  TempSrc:  Oral  Oral  SpO2:  98%  93%  Weight:      Height:        Intake/Output Summary (Last 24 hours) at 12/28/2019 0850 Last data filed at 12/28/2019 0300 Gross per 24 hour  Intake 413.67 ml  Output 1500 ml  Net -1086.33 ml   Filed Weights   12/27/19 0647 12/27/19 0652 12/27/19 1345  Weight: 134.2 kg 134.7 kg 134.2 kg     ECG    Sinus rhythm with RBBB and ST  depressions inferiorly and V2-V6 (most pronounced in V3) with corresponding TWI's - Personally Reviewed  Physical Exam   GEN: No audible distress.   Psych: Normal affect  Neuro: Alert and oriented  Labs    Chemistry Recent Labs  Lab 12/27/19 0749 12/27/19 1152 12/28/19 0505  NA 134* 138 134*  K 6.5* 5.4* 5.4*  CL 96* 100 93*  CO2 21* 23 24  GLUCOSE 327* 96 147*  BUN 58* 59* 43*  CREATININE 11.54* 12.14* 9.71*  CALCIUM 8.6* 8.9 8.9  PROT 7.3  --  7.7  ALBUMIN 3.2* 3.2* 3.4*  AST 21  --  33  ALT 28  --  33  ALKPHOS 118  --  94  BILITOT 0.9  --  0.8  GFRNONAA 4* 4* 5*  GFRAA 5* 5* 6*  ANIONGAP 17* 15 17*     Hematology Recent Labs  Lab 12/27/19 0749 12/27/19 1152 12/28/19 0505  WBC 7.7 8.5 6.6  RBC 3.58* 3.49* 3.52*  HGB 11.5* 11.2* 11.2*  HCT 37.2* 35.6* 35.8*  MCV 103.9* 102.0* 101.7*  MCH 32.1 32.1 31.8  MCHC 30.9 31.5 31.3  RDW 16.8* 16.7* 16.7*  PLT 178 188 181    Cardiac EnzymesNo results for input(s): TROPONINI in the last 168 hours. No results for input(s):  TROPIPOC in the last 168 hours.   BNPNo results for input(s): BNP, PROBNP in the last 168 hours.   DDimer  Recent Labs  Lab 12/27/19 0749  DDIMER 0.62*     Radiology    NM Pulmonary Perfusion  Result Date: 12/27/2019 CLINICAL DATA:  Shortness of breath. EXAM: NUCLEAR MEDICINE PERFUSION LUNG SCAN TECHNIQUE: Perfusion images were obtained in multiple projections after intravenous injection of radiopharmaceutical. Ventilation scans intentionally deferred if perfusion scan and chest x-ray adequate for interpretation during COVID 19 epidemic. RADIOPHARMACEUTICALS:  1.7 mCi Tc-23m MAA IV COMPARISON:  December 27, 2019. FINDINGS: No significant perfusion defect is noted. IMPRESSION: No definite perfusion defect is noted. No definite evidence of pulmonary embolus. Electronically Signed   By: Marijo Conception M.D.   On: 12/27/2019 13:23   DG Chest Portable 1 View  Result Date: 12/27/2019 CLINICAL  DATA:  Follow-up COVID-19 pneumonia. EXAM: PORTABLE CHEST 1 VIEW COMPARISON:  12/09/2019 and earlier. FINDINGS: Cardiac silhouette markedly enlarged, unchanged. Previously identified patchy ground-glass airspace opacities in the lung bases have resolved. Lungs now clear. Pulmonary vascularity normal without evidence of pulmonary edema. No visible pleural effusions. IMPRESSION: Resolution of the previously identified pneumonia involving the lung bases. No acute cardiopulmonary disease currently. Electronically Signed   By: Evangeline Dakin M.D.   On: 12/27/2019 08:06   ECHOCARDIOGRAM LIMITED  Result Date: 12/27/2019   ECHOCARDIOGRAM LIMITED REPORT   Patient Name:   Jesus Williams [facility] Date of Exam: 12/27/2019 Medical Rec #:  [id number]          Height:       67.0 in Accession #:    [id number]         Weight:       297.0 lb Date of Birth:  06/29/1961          BSA:          2.39 m Patient Age:    59 years           BP:           136/91 mmHg Patient Gender: M                  HR:           105 bpm. Exam Location:  [facility]  Procedure: 2D Echo, Cardiac Doppler and Color Doppler Indications:    Chest Pain  History:        Patient has no prior history of Echocardiogram examinations.                 ESRD, Covid,Acute respiratory distress,Morbid obesity.  Sonographer:    Alvino Chapel RCS Referring Phys: [id number] Garland D Avella  1. Left ventricular ejection fraction, by visual estimation, is 55 to 60%. The left ventricle has normal function. Unable to assess regional wall motion.  2. Global right ventricle has normal systolc function.The right ventricular size is mildly enlarged. Right ventricular wall thickness was not assessed.  3. Mild mitral annular calcification.  4. The mitral valve is grossly normal. Trivial mitral valve regurgitation.  5. No evidence of aortic valve sclerosis or stenosis.  6. The inferior vena cava is dilated in size with <50% respiratory variability, suggesting right atrial pressure of  15 mmHg.  7. Left ventricular diastolic parameters are consistent with Grade I diastolic dysfunction (impaired relaxation). FINDINGS  Left Ventricle: Left ventricular ejection fraction, by visual estimation, is 55 to 60%. The left ventricle has normal function. The left ventricular internal cavity size  was the left ventricle is normal in size. There is moderately increased left ventricular wall thickness. Concentric left ventricular hypertrophy. Left ventricular diastolic parameters are consistent with Grade I diastolic dysfunction (impaired relaxation). Right Ventricle: The right ventricular size is mildly enlarged. Right vetricular wall thickness was not assessed. Global RV systolic function is has normal systolic function. Left Atrium: Left atrial size was normal in size. Right Atrium: Right atrial size was not well visualized. Right atrial pressure is estimated at 15 mmHg. Pericardium: There is no evidence of pericardial effusion is seen. There is no evidence of pericardial effusion. Mitral Valve: The mitral valve is grossly normal. Mild mitral annular calcification. MV Area by PHT, 3.02 cm. MV PHT, 72.93 msec. Trivial mitral valve regurgitation. Tricuspid Valve: The tricuspid valve is not well visualized. Aortic Valve: The aortic valve is structurally normal, with no evidence of sclerosis or stenosis. Aorta: The aortic root is normal in size and structure. Venous: The inferior vena cava is dilated in size with less than 50% respiratory variability, suggesting right atrial pressure of 15 mmHg.  LEFT VENTRICLE          Normals PLAX 2D LVIDd:         4.85 cm  3.6 cm   Diastology                  Normals LVIDs:         3.36 cm  1.7 cm   LV e' lateral:   10.30 cm/s 6.42 cm/s LV PW:         1.43 cm  1.4 cm   LV E/e' lateral: 5.4        15.4 LV IVS:        1.42 cm  1.3 cm   LV e' medial:    5.87 cm/s  6.96 cm/s LVOT diam:     1.90 cm  2.0 cm   LV E/e' medial:  9.5        6.96 LV SV:         64 ml    79 ml LV SV  Index:   24.66    45 ml/m2 LVOT Area:     2.84 cm 3.14 cm2  RIGHT VENTRICLE RV S prime:     11.10 cm/s TAPSE (M-mode): 1.1 cm LEFT ATRIUM         Index LA diam:    2.90 cm 1.21 cm/m   AORTA                 Normals Ao Root diam: 3.05 cm 31 mm MITRAL VALVE              Normals MV Area (PHT): 3.02 cm             SHUNTS MV PHT:        72.93 msec 55 ms     Systemic Diam: 1.90 cm MV Decel Time: 252 msec   187 ms MV E velocity: 55.55 cm/s 103 cm/s MV A velocity: 66.70 cm/s 70.3 cm/s MV E/A ratio:  0.83       1.5  Kate Sable MD Electronically signed by Kate Sable MD Signature Date/Time: 12/27/2019/3:23:43 PMThe mitral valve is grossly normal.    Final      Patient Profile     59 y.o. male with a hx of recent Covid-19 PNA, morbid obesity, OSA (intolerant of CPAP), ESRD on HD x 3 years, hyponatremia, gouty arthritis, HLD, chronic respiratory failure on home O2, DM (A1C  7.0 12/07/19), RBBB, mild anemia (during recent admit) who is being seen today for the evaluation of shortness of breath, abnormal EKG/troponin at the request of Dr. Regenia Skeeter.  Assessment & Plan    1. NSTEMI: Troponins continue to rise (most recent 6.317). Asymptomatic at rest. LV systolic function normal by echo on 1/18, LVEF 55-60%. Currently on IV heparin, ASA, carvedilol, and atorvastatin. Plan is to transfer to [facility] for coronary angiography. He is agreeable to proceed. I have spoken with Dr. Irish Lack as well. Risks and benefits of cardiac catheterization have been discussed with the patient.  These include bleeding, infection, kidney damage, stroke, heart attack, death.  The patient understands these risks and is willing to proceed.  2. ESRD on HD: Dialyzed yesterday. Per Nephrology.  3. Morbid obesity: Once recovered would benefit from lifestyle management. Has not been able to tolerate CPAP because it makes him feel worse.  4. DM: per IM.  5. OSA: Intolerant of CPAP.   For questions or updates, please contact  [place] Please consult [email or web address] for contact info under Cardiology/STEMI.      Signed, Kate Sable, MD  12/28/2019, 8:50 AM

## 2019-12-28 NOTE — Progress Notes (Signed)
Patient arrived from EMS with no complaints of pain and no visible distress. [name] verified and new arm band placed. Patient placed on monitor and BP and HR will continue to be monitored until taken to lab.

## 2019-12-28 NOTE — Progress Notes (Signed)
   Chart reviewed and Dr Linus Salmons w/ ID contacted re: plan. Pt initial COVID dx was 12/06/2019, d/c 12/11/2019  Per Dr Linus Salmons: DO NOT retest him for COVID, he will still be positive even though not contagious.  After 21 days from initial dx, does not need isolation precautions.  21 days from his initial dx was 01/18, today is day #22>>can come off isolation.  Info communicated to Dr Irish Lack and Cath lab.   Rosaria Ferries, PA-C 12/28/2019 9:05 AM Beeper [phone]

## 2019-12-28 NOTE — Progress Notes (Signed)
Patient denies chest pain or shortness of breath. Continues on heparin drip for elevated troponin. Hospitalist is aware vitals are stable running sinus rhythm on cardiac monitor, continue to monitor troponin per orders.

## 2019-12-28 NOTE — Progress Notes (Signed)
CRITICAL VALUE ALERT  Critical Value:  troponin  Date & Time Notied:  12/28/19 2:50 AM   Provider Notified: opyd  Orders Received/Actions taken: none

## 2019-12-28 NOTE — Progress Notes (Signed)
Patient Demographics:    Jesus Williams, is a 59 y.o. male, DOB - 1961-08-28, [id number]  Admit date - 12/27/2019   Admitting Physician Pratik Darleen Crocker, DO  Outpatient Primary MD for the patient is System, Provider Not In  LOS - 1   Chief Complaint  Patient presents with  . Shortness of Breath        Subjective:    Jesus Williams today has no fevers, no emesis,  No chest pain at this time -Dyspnea persist  Assessment  & Plan :    Principal Problem:   Pneumonia due to COVID-19 virus Active Problems:   ESRD (end stage renal disease) (HCC)   NSTEMI (non-ST elevated myocardial infarction) ([place])   Dyspnea   Hypertension   Morbid obesity (HCC)   OSA (obstructive sleep apnea)  Brief Summary:- 59 year old with history of ESRD on Monday Wednesday Friday HD schedule, HTN, morbid obesity with OSA, and DM2 admitted on 12/27/2019 with worsening dyspnea and finding of NSTEMI in the context of COVID-19 respiratory virus infection with patient hospitalization for Covid pneumonia from 12/06/2019 to 12/11/2019 -Troponin continued to trend up despite medical management and IV heparin and hemodialysis -Transferred to [place] for left heart cath and further cardiovascular evaluation under hospitalist service with cardiology consult  A/p 1)NSTEMI/ dyspnea--VQ scan low probability for PE, currently on IV heparin, continue aspirin and Coreg as well as Lipitor, echo with preserved EF of 55 to 60%, LHC is pending at [facility] -Further management per cardiology team  2) recent COVID-19 pneumonia--per ID physician Dr.Comer--- no need for further airborne isolation as patient is more than 21 days from diagnosis -Patient completed steroids on remdesivir for Covid pneumonia from 12/06/2019 to 12/11/2019  3) ESRD--HD usually Monday Wednesdays Fridays, nephrology will coordinate dialysis schedule cardiology so he  can be dialyzed after LHC  4) hyperkalemia--- potassium remains elevated as per EGD on 12/26/2018, Lokelma given--  5) anemia of ESRD--EPO/ESA agent per nephrology team, stable at this time,  6) morbid obesity/OSA--- patient apparently does not tolerate CPAP  7) chronic hypoxic respiratory failure, patient on oxygen chronically at home  8)DM2--.  Recent A1c 7.0 reflecting good diabetic control PTA, Use Novolog/Humalog Sliding scale insulin with Accu-Cheks/Fingersticks as ordered   9)GOUT--- no flareup recently, continue allopurinol  Disposition/Need for in-Hospital Stay- patient unable to be discharged at this time due to [place] for [facility], currently requiring IV heparin  Code Status : Full  Family Communication:   NA (patient is alert, awake and coherent)   Disposition Plan  : Transfer to [facility]  Consults  : Cardiology/nephrology  DVT Prophylaxis  : IV heparin SCDs   Lab Results  Component Value Date   PLT 181 12/28/2019    Inpatient Medications  Scheduled Meds: . [MAR Hold] allopurinol  100 mg Oral Daily  . [MAR Hold] aspirin  81 mg Oral Daily  . [MAR Hold] atorvastatin  80 mg Oral Daily  . [MAR Hold] carvedilol  25 mg Oral BID WC  . [MAR Hold] Chlorhexidine Gluconate Cloth  6 each Topical Q0600  . [MAR Hold] Chlorhexidine Gluconate Cloth  6 each Topical Q0600  . [MAR Hold] insulin aspart  0-5 Units Subcutaneous QHS  . [MAR Hold] insulin  aspart  0-6 Units Subcutaneous TID WC  . [MAR Hold] insulin aspart  2 Units Subcutaneous TID WC  . [MAR Hold] loratadine  5 mg Oral Daily  . [MAR Hold] sevelamer carbonate  800 mg Oral TID WC  . [MAR Hold] sodium chloride flush  3 mL Intravenous Q12H  . [MAR Hold] sodium chloride flush  3 mL Intravenous Q12H  . [MAR Hold] sodium zirconium cyclosilicate  10 g Oral TID   Continuous Infusions: . [MAR Hold] sodium chloride    . sodium chloride    . sodium chloride    . heparin 1,500 Units/hr (12/28/19 0855)   PRN  Meds:.[MAR Hold] sodium chloride, sodium chloride, [MAR Hold] acetaminophen **OR** [MAR Hold] acetaminophen, [MAR Hold] alteplase, [MAR Hold] heparin, [MAR Hold] lidocaine (PF), [MAR Hold] lidocaine-prilocaine, [MAR Hold] ondansetron **OR** [MAR Hold] ondansetron (ZOFRAN) IV, [MAR Hold] pentafluoroprop-tetrafluoroeth, [MAR Hold] sodium chloride flush, sodium chloride flush    Anti-infectives (From admission, onward)   None        Objective:   Vitals:   12/27/19 1706 12/27/19 2119 12/28/19 0100 12/28/19 0416  BP: 104/64 (!) 107/57 (!) 124/93 113/64  Pulse: (!) 110 (!) 103  (!) 102  Resp:  20  20  Temp:  99.8 F (37.7 C)  100.2 F (37.9 C)  TempSrc:  Oral  Oral  SpO2:  98%  93%  Weight:      Height:        Wt Readings from Last 3 Encounters:  12/27/19 134.2 kg  12/11/19 134.2 kg     Intake/Output Summary (Last 24 hours) at 12/28/2019 1302 Last data filed at 12/28/2019 0900 Gross per 24 hour  Intake 173.67 ml  Output 1500 ml  Net -1326.33 ml     Physical Exam  Gen:- Awake Alert,  In no apparent distress  HEENT:- [place].AT, No sclera icterus Neck-Supple Neck,No JVD,.  Lungs-diminished in bases, no wheezing CV- S1, S2 normal, regular,  Abd-  +ve B.Sounds, Abd Soft, No tenderness,    Extremity/Skin:- No  edema, pedal pulses present  Psych-affect is appropriate, oriented x3 Neuro-no new focal deficits, no tremors MSk-left forearm AV fistula positive thrill and bruit   Data Review:   Micro Results No results found for this or any previous visit (from the past 240 hour(s)).  Radiology Reports NM Pulmonary Perfusion  Result Date: 12/27/2019 CLINICAL DATA:  Shortness of breath. EXAM: NUCLEAR MEDICINE PERFUSION LUNG SCAN TECHNIQUE: Perfusion images were obtained in multiple projections after intravenous injection of radiopharmaceutical. Ventilation scans intentionally deferred if perfusion scan and chest x-ray adequate for interpretation during COVID 19 epidemic.  RADIOPHARMACEUTICALS:  1.7 mCi Tc-98m MAA IV COMPARISON:  December 27, 2019. FINDINGS: No significant perfusion defect is noted. IMPRESSION: No definite perfusion defect is noted. No definite evidence of pulmonary embolus. Electronically Signed   By: Marijo Conception M.D.   On: 12/27/2019 13:23   DG Chest Portable 1 View  Result Date: 12/27/2019 CLINICAL DATA:  Follow-up COVID-19 pneumonia. EXAM: PORTABLE CHEST 1 VIEW COMPARISON:  12/09/2019 and earlier. FINDINGS: Cardiac silhouette markedly enlarged, unchanged. Previously identified patchy ground-glass airspace opacities in the lung bases have resolved. Lungs now clear. Pulmonary vascularity normal without evidence of pulmonary edema. No visible pleural effusions. IMPRESSION: Resolution of the previously identified pneumonia involving the lung bases. No acute cardiopulmonary disease currently. Electronically Signed   By: Evangeline Dakin M.D.   On: 12/27/2019 08:06   DG CHEST PORT 1 VIEW  Result Date: 12/09/2019 CLINICAL DATA:  COVID-19 pneumonia  EXAM: PORTABLE CHEST 1 VIEW COMPARISON:  Two days ago FINDINGS: Cardiomegaly. Improved aeration. There is no edema, consolidation, effusion, or pneumothorax. IMPRESSION: 1. Improved aeration. 2. Cardiomegaly without failure. Electronically Signed   By: Monte Fantasia M.D.   On: 12/09/2019 07:15   DG CHEST PORT 1 VIEW  Result Date: 12/07/2019 CLINICAL DATA:  COVID positive EXAM: PORTABLE CHEST 1 VIEW COMPARISON:  12/06/2019 FINDINGS: Cardiomegaly. There may be subtle heterogeneous airspace opacity in the bilateral lung bases. Osseous structures are unremarkable. IMPRESSION: 1. There may be subtle heterogeneous airspace opacity in the bilateral lung bases, generally in keeping with COVID-19 airspace disease. This appears new or increased compared to prior radiographs. PA and lateral radiographs or CT may be helpful to further evaluate. 2.  Cardiomegaly. Electronically Signed   By: Eddie Candle M.D.   On:  12/07/2019 11:20   DG Chest Port 1 View  Result Date: 12/06/2019 CLINICAL DATA:  Shortness of breath EXAM: PORTABLE CHEST 1 VIEW COMPARISON:  04/23/2013 FINDINGS: Cardiomegaly with mild central congestion. No pleural effusion or overt pulmonary edema. No pneumothorax. IMPRESSION: Cardiomegaly with mild central congestion. No definite focal airspace disease. Electronically Signed   By: Donavan Foil M.D.   On: 12/06/2019 22:39   ECHOCARDIOGRAM LIMITED  Result Date: 12/27/2019   ECHOCARDIOGRAM LIMITED REPORT   Patient Name:   Jesus Williams [facility] Date of Exam: 12/27/2019 Medical Rec #:  [id number]          Height:       67.0 in Accession #:    [id number]         Weight:       297.0 lb Date of Birth:  11-28-1961          BSA:          2.39 m Patient Age:    14 years           BP:           136/91 mmHg Patient Gender: M                  HR:           105 bpm. Exam Location:  [facility]  Procedure: 2D Echo, Cardiac Doppler and Color Doppler Indications:    Chest Pain  History:        Patient has no prior history of Echocardiogram examinations.                 ESRD, Covid,Acute respiratory distress,Morbid obesity.  Sonographer:    Alvino Chapel RCS Referring Phys: [id number] [place]  1. Left ventricular ejection fraction, by visual estimation, is 55 to 60%. The left ventricle has normal function. Unable to assess regional wall motion.  2. Global right ventricle has normal systolc function.The right ventricular size is mildly enlarged. Right ventricular wall thickness was not assessed.  3. Mild mitral annular calcification.  4. The mitral valve is grossly normal. Trivial mitral valve regurgitation.  5. No evidence of aortic valve sclerosis or stenosis.  6. The inferior vena cava is dilated in size with <50% respiratory variability, suggesting right atrial pressure of 15 mmHg.  7. Left ventricular diastolic parameters are consistent with Grade I diastolic dysfunction (impaired relaxation). FINDINGS  Left  Ventricle: Left ventricular ejection fraction, by visual estimation, is 55 to 60%. The left ventricle has normal function. The left ventricular internal cavity size was the left ventricle is normal in size. There is moderately increased left ventricular wall thickness.  Concentric left ventricular hypertrophy. Left ventricular diastolic parameters are consistent with Grade I diastolic dysfunction (impaired relaxation). Right Ventricle: The right ventricular size is mildly enlarged. Right vetricular wall thickness was not assessed. Global RV systolic function is has normal systolic function. Left Atrium: Left atrial size was normal in size. Right Atrium: Right atrial size was not well visualized. Right atrial pressure is estimated at 15 mmHg. Pericardium: There is no evidence of pericardial effusion is seen. There is no evidence of pericardial effusion. Mitral Valve: The mitral valve is grossly normal. Mild mitral annular calcification. MV Area by PHT, 3.02 cm. MV PHT, 72.93 msec. Trivial mitral valve regurgitation. Tricuspid Valve: The tricuspid valve is not well visualized. Aortic Valve: The aortic valve is structurally normal, with no evidence of sclerosis or stenosis. Aorta: The aortic root is normal in size and structure. Venous: The inferior vena cava is dilated in size with less than 50% respiratory variability, suggesting right atrial pressure of 15 mmHg.  LEFT VENTRICLE          Normals PLAX 2D LVIDd:         4.85 cm  3.6 cm   Diastology                  Normals LVIDs:         3.36 cm  1.7 cm   LV e' lateral:   10.30 cm/s 6.42 cm/s LV PW:         1.43 cm  1.4 cm   LV E/e' lateral: 5.4        15.4 LV IVS:        1.42 cm  1.3 cm   LV e' medial:    5.87 cm/s  6.96 cm/s LVOT diam:     1.90 cm  2.0 cm   LV E/e' medial:  9.5        6.96 LV SV:         64 ml    79 ml LV SV Index:   24.66    45 ml/m2 LVOT Area:     2.84 cm 3.14 cm2  RIGHT VENTRICLE RV S prime:     11.10 cm/s TAPSE (M-mode): 1.1 cm LEFT ATRIUM          Index LA diam:    2.90 cm 1.21 cm/m   AORTA                 Normals Ao Root diam: 3.05 cm 31 mm MITRAL VALVE              Normals MV Area (PHT): 3.02 cm             SHUNTS MV PHT:        72.93 msec 55 ms     Systemic Diam: 1.90 cm MV Decel Time: 252 msec   187 ms MV E velocity: 55.55 cm/s 103 cm/s MV A velocity: 66.70 cm/s 70.3 cm/s MV E/A ratio:  0.83       1.5  Kate Sable MD Electronically signed by Kate Sable MD Signature Date/Time: 12/27/2019/3:23:43 PMThe mitral valve is grossly normal.    Final      CBC Recent Labs  Lab 12/27/19 0749 12/27/19 1152 12/28/19 0505  WBC 7.7 8.5 6.6  HGB 11.5* 11.2* 11.2*  HCT 37.2* 35.6* 35.8*  PLT 178 188 181  MCV 103.9* 102.0* 101.7*  MCH 32.1 32.1 31.8  MCHC 30.9 31.5 31.3  RDW 16.8* 16.7* 16.7*  LYMPHSABS 0.9  --   --  MONOABS 1.1*  --   --   EOSABS 0.3  --   --   BASOSABS 0.0  --   --     Chemistries  Recent Labs  Lab 12/27/19 0749 12/27/19 1152 12/28/19 0505  NA 134* 138 134*  K 6.5* 5.4* 5.4*  CL 96* 100 93*  CO2 21* 23 24  GLUCOSE 327* 96 147*  BUN 58* 59* 43*  CREATININE 11.54* 12.14* 9.71*  CALCIUM 8.6* 8.9 8.9  MG  --   --  1.7  AST 21  --  33  ALT 28  --  33  ALKPHOS 118  --  94  BILITOT 0.9  --  0.8   ------------------------------------------------------------------------------------------------------------------ Recent Labs    12/28/19 0505  CHOL 84  HDL 32*  LDLCALC 32  TRIG 98  CHOLHDL 2.6    Lab Results  Component Value Date   HGBA1C 7.0 (H) 12/07/2019   ------------------------------------------------------------------------------------------------------------------ No results for input(s): TSH, T4TOTAL, T3FREE, THYROIDAB in the last 72 hours.  Invalid input(s): FREET3 ------------------------------------------------------------------------------------------------------------------ No results for input(s): VITAMINB12, FOLATE, FERRITIN, TIBC, IRON, RETICCTPCT in the last 72  hours.  Coagulation profile No results for input(s): INR, PROTIME in the last 168 hours.  Recent Labs    12/27/19 0749  DDIMER 0.62*    Cardiac Enzymes No results for input(s): CKMB, TROPONINI, MYOGLOBIN in the last 168 hours.  Invalid input(s): CK ------------------------------------------------------------------------------------------------------------------ No results found for: BNP   Roxan Hockey M.D on 12/28/2019 at 1:02 PM  Go to [email or web address] - for contact info  [facility] - Office  [phone]

## 2019-12-28 NOTE — Progress Notes (Signed)
[facility] ROUNDING NOTE   Subjective:   59 year old gentleman with a history of recent Covid pneumonia, end-stage renal disease Monday Wednesday Friday [facility] dialysis.  Last hospitalization was from 12/06/2019 to 12/11/2019 with a diagnosis of acute on chronic respiratory failure secondary to Covid pneumonia.  Outpatient dialysis prescription is as follows  Outpatient dialysis orders MWF 4 hours and 71minutes  2K/2.5 calcium bath EDW is 132 kg  Heparin 3000 units load and hourly dose of 1200 units stop 1 hour before end of dialysis epogen 1200 units IVP three times weekly  hectorol 3.5 mcg three times weekly  venofer 50 mg once weekly  VQ scan showed no perfusion deficit.  Echocardiogram showed an ejection fraction of 50-60% global right ventricular systolic function was normal mild mitral annular calcification mitral valve grossly normal with trivial mitral regurgitation.  No aortic valve sclerosis or stenosis.  Left ventricular diastolic parameters are consistent with grade 1 diastolic dysfunction.  Troponins most recent 6317.  Appreciate assistance from cardiology. Dr Bronson Ing.  Patient agreeable for cardiac catheterization  Blood pressure 114/64 pulse 102 temperature 100.2 O2 sats 93% room air  Sodium 134 potassium 5.4 chloride 93 CO2 24 BUN 43 creatinine 9.71 glucose 157 calcium 8.9 AST 33 ALT 33 LDL cholesterol 32  Allopurinol 100 mg daily, aspirin 81 mg daily, Lipitor 80 mg daily Coreg 25 mg twice daily, IV heparin, sliding scale insulin, Renvela 800 mg with meals, Lokelma 10 g given this morning.  Successful dialysis 12/27/2019 with removal of 1.5 L  Objective:  Vital signs in last 24 hours:  Temp:  [98.8 F (37.1 C)-100.2 F (37.9 C)] 100.2 F (37.9 C) (01/19 0416) Pulse Rate:  [96-113] 102 (01/19 0416) Resp:  [14-20] 20 (01/19 0416) BP: (84-136)/(51-93) 113/64 (01/19 0416) SpO2:  [92 %-100 %] 93 % (01/19 0416) Weight:  [134.2 kg] 134.2 kg (01/18  1345)  Weight change: 0 kg Filed Weights   12/27/19 0647 12/27/19 0652 12/27/19 1345  Weight: 134.2 kg 134.7 kg 134.2 kg    Intake/Output: I/O last 3 completed shifts: In: 413.7 [P.O.:240; I.V.:173.7] Out: 1500 [Other:1500]   Intake/Output this shift:  No intake/output data recorded. Alert and oriented  CVS- RRR JVP not elevated RS- CTA with occasional lower lobe rales ABD- BS present so ft non-distended EXT- no edema left forearm fistula   Basic Metabolic Panel: Recent Labs  Lab 12/27/19 0749 12/27/19 1152 12/28/19 0505  NA 134* 138 134*  K 6.5* 5.4* 5.4*  CL 96* 100 93*  CO2 21* 23 24  GLUCOSE 327* 96 147*  BUN 58* 59* 43*  CREATININE 11.54* 12.14* 9.71*  CALCIUM 8.6* 8.9 8.9  MG  --   --  1.7  PHOS  --  5.9*  --     Liver Function Tests: Recent Labs  Lab 12/27/19 0749 12/27/19 1152 12/28/19 0505  AST 21  --  33  ALT 28  --  33  ALKPHOS 118  --  94  BILITOT 0.9  --  0.8  PROT 7.3  --  7.7  ALBUMIN 3.2* 3.2* 3.4*   No results for input(s): LIPASE, AMYLASE in the last 168 hours. No results for input(s): AMMONIA in the last 168 hours.  CBC: Recent Labs  Lab 12/27/19 0749 12/27/19 1152 12/28/19 0505  WBC 7.7 8.5 6.6  NEUTROABS 5.4  --   --   HGB 11.5* 11.2* 11.2*  HCT 37.2* 35.6* 35.8*  MCV 103.9* 102.0* 101.7*  PLT 178 188 181  Cardiac Enzymes: No results for input(s): CKTOTAL, CKMB, CKMBINDEX, TROPONINI in the last 168 hours.  BNP: Invalid input(s): POCBNP  CBG: Recent Labs  Lab 12/27/19 0859 12/27/19 0932 12/27/19 1757 12/27/19 2125 12/28/19 0753  GLUCAP 178* 151* 111* 120* 118*    Microbiology: Results for orders placed or performed during the hospital encounter of 12/06/19  SARS CORONAVIRUS 2 (TAT 6-24 HRS) Nasopharyngeal Nasopharyngeal Swab     Status: Abnormal   Collection Time: 12/06/19 10:35 PM   Specimen: Nasopharyngeal Swab  Result Value Ref Range Status   SARS Coronavirus 2 POSITIVE (A) NEGATIVE Final     Comment: RESULT CALLED TO, READ BACK BY AND VERIFIED WITH: Jani Gravel RN 13:30 12/07/19 ([email or web address]) (NOTE) SARS-CoV-2 target nucleic acids are DETECTED. The SARS-CoV-2 RNA is generally detectable in upper and lower respiratory specimens during the acute phase of infection. Positive results are indicative of the presence of SARS-CoV-2 RNA. Clinical correlation with patient history and other diagnostic information is  necessary to determine patient infection status. Positive results do not rule out bacterial infection or co-infection with other viruses.  The expected result is Negative. Fact Sheet for Patients: SugarRoll.be Fact Sheet for Healthcare Providers: https://www.woods-mathews.com/ This test is not yet approved or cleared by the [place] FDA and  has been authorized for detection and/or diagnosis of SARS-CoV-2 by FDA under an Emergency Use Authorization (EUA). This EUA will remain  in effect (meaning this test can be used) for t he duration of the COVID-19 declaration under Section 564(b)(1) of the Act, 21 U.S.C. section 360bbb-3(b)(1), unless the authorization is terminated or revoked sooner. Performed at [facility], [place] [address], [place], [place] [address]   Blood Culture (routine x 2)     Status: None   Collection Time: 12/06/19 10:35 PM   Specimen: Right Antecubital; Blood  Result Value Ref Range Status   Specimen Description RIGHT ANTECUBITAL  Final   Special Requests   Final    BOTTLES DRAWN AEROBIC AND ANAEROBIC Blood Culture adequate volume   Culture   Final    NO GROWTH 5 DAYS Performed at [facility], [address] [place], [place] [address]    Report Status 12/11/2019 FINAL  Final  Blood Culture (routine x 2)     Status: None   Collection Time: 12/06/19 11:11 PM   Specimen: BLOOD RIGHT HAND  Result Value Ref Range Status   Specimen Description BLOOD RIGHT HAND  Final   Special Requests   Final     BOTTLES DRAWN AEROBIC AND ANAEROBIC Blood Culture adequate volume   Culture   Final    NO GROWTH 5 DAYS Performed at [facility], [address] [place], [place] [address]    Report Status 12/11/2019 FINAL  Final  Respiratory Panel by RT PCR (Flu A&B, Covid) - Nasopharyngeal Swab     Status: Abnormal   Collection Time: 12/07/19  4:54 AM   Specimen: Nasopharyngeal Swab  Result Value Ref Range Status   SARS Coronavirus 2 by RT PCR POSITIVE (A) NEGATIVE Final    Comment: RESULT CALLED TO, READ BACK BY AND VERIFIED WITH: SHORE L. AT 0734A ON [id number] BY THOMPSON S.    Influenza A by PCR NEGATIVE NEGATIVE Final   Influenza B by PCR NEGATIVE NEGATIVE Final    Comment: (NOTE) The Xpert Xpress SARS-CoV-2/FLU/RSV assay is intended as an aid in  the diagnosis of influenza from Nasopharyngeal swab specimens and  should not be used as a sole basis for treatment.  Nasal washings and  aspirates are unacceptable for Xpert Xpress SARS-CoV-2/FLU/RSV  testing. Fact Sheet for Patients: PinkCheek.be Fact Sheet for Healthcare Providers: GravelBags.it This test is not yet approved or cleared by the [place] FDA and  has been authorized for detection and/or diagnosis of SARS-CoV-2 by  FDA under an Emergency Use Authorization (EUA). This EUA will remain  in effect (meaning this test can be used) for the duration of the  Covid-19 declaration under Section 564(b)(1) of the Act, 21  U.S.C. section 360bbb-3(b)(1), unless the authorization is  terminated or revoked. Performed at [facility], [address] [place], [place] [address]     Coagulation Studies: No results for input(s): LABPROT, INR in the last 72 hours.  Urinalysis: No results for input(s): COLORURINE, LABSPEC, PHURINE, GLUCOSEU, HGBUR, BILIRUBINUR, KETONESUR, PROTEINUR, UROBILINOGEN, NITRITE, LEUKOCYTESUR in the last 72 hours.  Invalid input(s): APPERANCEUR    Imaging: NM  Pulmonary Perfusion  Result Date: 12/27/2019 CLINICAL DATA:  Shortness of breath. EXAM: NUCLEAR MEDICINE PERFUSION LUNG SCAN TECHNIQUE: Perfusion images were obtained in multiple projections after intravenous injection of radiopharmaceutical. Ventilation scans intentionally deferred if perfusion scan and chest x-ray adequate for interpretation during COVID 19 epidemic. RADIOPHARMACEUTICALS:  1.7 mCi Tc-28m MAA IV COMPARISON:  December 27, 2019. FINDINGS: No significant perfusion defect is noted. IMPRESSION: No definite perfusion defect is noted. No definite evidence of pulmonary embolus. Electronically Signed   By: Marijo Conception M.D.   On: 12/27/2019 13:23   DG Chest Portable 1 View  Result Date: 12/27/2019 CLINICAL DATA:  Follow-up COVID-19 pneumonia. EXAM: PORTABLE CHEST 1 VIEW COMPARISON:  12/09/2019 and earlier. FINDINGS: Cardiac silhouette markedly enlarged, unchanged. Previously identified patchy ground-glass airspace opacities in the lung bases have resolved. Lungs now clear. Pulmonary vascularity normal without evidence of pulmonary edema. No visible pleural effusions. IMPRESSION: Resolution of the previously identified pneumonia involving the lung bases. No acute cardiopulmonary disease currently. Electronically Signed   By: Evangeline Dakin M.D.   On: 12/27/2019 08:06   ECHOCARDIOGRAM LIMITED  Result Date: 12/27/2019   ECHOCARDIOGRAM LIMITED REPORT   Patient Name:   MAKII ARCIDIACONO [facility] Date of Exam: 12/27/2019 Medical Rec #:  [id number]          Height:       67.0 in Accession #:    [id number]         Weight:       297.0 lb Date of Birth:  27-Mar-1961          BSA:          2.39 m Patient Age:    38 years           BP:           136/91 mmHg Patient Gender: M                  HR:           105 bpm. Exam Location:  [facility]  Procedure: 2D Echo, Cardiac Doppler and Color Doppler Indications:    Chest Pain  History:        Patient has no prior history of Echocardiogram examinations.                  ESRD, Covid,Acute respiratory distress,Morbid obesity.  Sonographer:    Alvino Chapel RCS Referring Phys: [id number] [place]as[place] D Bayard  1. Left ventricular ejection fraction, by visual estimation, is 55 to 60%. The left ventricle has normal function. Unable to assess regional  wall motion.  2. Global right ventricle has normal systolc function.The right ventricular size is mildly enlarged. Right ventricular wall thickness was not assessed.  3. Mild mitral annular calcification.  4. The mitral valve is grossly normal. Trivial mitral valve regurgitation.  5. No evidence of aortic valve sclerosis or stenosis.  6. The inferior vena cava is dilated in size with <50% respiratory variability, suggesting right atrial pressure of 15 mmHg.  7. Left ventricular diastolic parameters are consistent with Grade I diastolic dysfunction (impaired relaxation). FINDINGS  Left Ventricle: Left ventricular ejection fraction, by visual estimation, is 55 to 60%. The left ventricle has normal function. The left ventricular internal cavity size was the left ventricle is normal in size. There is moderately increased left ventricular wall thickness. Concentric left ventricular hypertrophy. Left ventricular diastolic parameters are consistent with Grade I diastolic dysfunction (impaired relaxation). Right Ventricle: The right ventricular size is mildly enlarged. Right vetricular wall thickness was not assessed. Global RV systolic function is has normal systolic function. Left Atrium: Left atrial size was normal in size. Right Atrium: Right atrial size was not well visualized. Right atrial pressure is estimated at 15 mmHg. Pericardium: There is no evidence of pericardial effusion is seen. There is no evidence of pericardial effusion. Mitral Valve: The mitral valve is grossly normal. Mild mitral annular calcification. MV Area by PHT, 3.02 cm. MV PHT, 72.93 msec. Trivial mitral valve regurgitation. Tricuspid Valve: The tricuspid valve is not  well visualized. Aortic Valve: The aortic valve is structurally normal, with no evidence of sclerosis or stenosis. Aorta: The aortic root is normal in size and structure. Venous: The inferior vena cava is dilated in size with less than 50% respiratory variability, suggesting right atrial pressure of 15 mmHg.  LEFT VENTRICLE          Normals PLAX 2D LVIDd:         4.85 cm  3.6 cm   Diastology                  Normals LVIDs:         3.36 cm  1.7 cm   LV e' lateral:   10.30 cm/s 6.42 cm/s LV PW:         1.43 cm  1.4 cm   LV E/e' lateral: 5.4        15.4 LV IVS:        1.42 cm  1.3 cm   LV e' medial:    5.87 cm/s  6.96 cm/s LVOT diam:     1.90 cm  2.0 cm   LV E/e' medial:  9.5        6.96 LV SV:         64 ml    79 ml LV SV Index:   24.66    45 ml/m2 LVOT Area:     2.84 cm 3.14 cm2  RIGHT VENTRICLE RV S prime:     11.10 cm/s TAPSE (M-mode): 1.1 cm LEFT ATRIUM         Index LA diam:    2.90 cm 1.21 cm/m   AORTA                 Normals Ao Root diam: 3.05 cm 31 mm MITRAL VALVE              Normals MV Area (PHT): 3.02 cm             SHUNTS MV PHT:  72.93 msec 55 ms     Systemic Diam: 1.90 cm MV Decel Time: 252 msec   187 ms MV E velocity: 55.55 cm/s 103 cm/s MV A velocity: 66.70 cm/s 70.3 cm/s MV E/A ratio:  0.83       1.5  Kate Sable MD Electronically signed by Kate Sable MD Signature Date/Time: 12/27/2019/3:23:43 PMThe mitral valve is grossly normal.    Final      Medications:   . sodium chloride    . sodium chloride    . sodium chloride    . heparin 1,500 Units/hr (12/28/19 0855)   . allopurinol  100 mg Oral Daily  . aspirin  81 mg Oral Daily  . atorvastatin  80 mg Oral Daily  . carvedilol  25 mg Oral BID WC  . Chlorhexidine Gluconate Cloth  6 each Topical Q0600  . insulin aspart  0-5 Units Subcutaneous QHS  . insulin aspart  0-6 Units Subcutaneous TID WC  . insulin aspart  2 Units Subcutaneous TID WC  . loratadine  5 mg Oral Daily  . sevelamer carbonate  800 mg Oral TID WC  .  sodium chloride flush  3 mL Intravenous Q12H  . sodium chloride flush  3 mL Intravenous Q12H  . sodium zirconium cyclosilicate  10 g Oral Once   sodium chloride, sodium chloride, acetaminophen **OR** acetaminophen, alteplase, heparin, lidocaine (PF), lidocaine-prilocaine, ondansetron **OR** ondansetron (ZOFRAN) IV, pentafluoroprop-tetrafluoroeth, sodium chloride flush, sodium chloride flush  Assessment/ Plan:   ESRD-Monday Wednesday Friday dialysis.  [facility] dialysis.  Will schedule for his regular dialysis session.  Hyperkalemia will dialyze today use 1K bath.  Patient has been given Lokelma will give Lokelma 3 times daily  ANEMIA-does not appear to be an issue at this time  MBD-continue binders and vitamin D, 3.5 mcg of Hectorol Monday Wednesday Friday  HTN/VOL-we will aim for fluid removal.  To challenge to euvolemia.  ACCESS-left forearm fistula  Diabetes as per primary team  Shortness of breath 2D echo shows diastolic dysfunction.  Troponin is greater than 6000 indicating NSTEMI.  Cardiac catheterization planned      LOS: [place] @TODAY @9 :40 AM

## 2019-12-28 NOTE — H&P (View-Only) (Signed)
Progress Note  Patient Name: Jesus Williams Date of Encounter: 12/28/2019  Primary Cardiologist: Kate Sable, MD   Due to the COVID-19 pandemic, the visit was completed with telemedicine (audio) technology to reduce patient and provider exposure as well as to preserve personal protective equipment.   Subjective   No chest pain or shortness of breath at rest. Wondering when he'll be able to eat.  Inpatient Medications    Scheduled Meds: . allopurinol  100 mg Oral Daily  . aspirin  81 mg Oral Daily  . atorvastatin  80 mg Oral Daily  . carvedilol  25 mg Oral BID WC  . Chlorhexidine Gluconate Cloth  6 each Topical Q0600  . heparin  1,500 Units Intravenous Once  . insulin aspart  0-5 Units Subcutaneous QHS  . insulin aspart  0-6 Units Subcutaneous TID WC  . insulin aspart  2 Units Subcutaneous TID WC  . loratadine  5 mg Oral Daily  . sevelamer carbonate  800 mg Oral TID WC  . sodium chloride flush  3 mL Intravenous Q12H  . sodium zirconium cyclosilicate  10 g Oral Once   Continuous Infusions: . sodium chloride    . heparin 1,300 Units/hr (12/28/19 0253)   PRN Meds: sodium chloride, acetaminophen **OR** acetaminophen, alteplase, heparin, lidocaine (PF), lidocaine-prilocaine, ondansetron **OR** ondansetron (ZOFRAN) IV, pentafluoroprop-tetrafluoroeth, sodium chloride flush   Vital Signs    Vitals:   12/27/19 1706 12/27/19 2119 12/28/19 0100 12/28/19 0416  BP: 104/64 (!) 107/57 (!) 124/93 113/64  Pulse: (!) 110 (!) 103  (!) 102  Resp:  20  20  Temp:  99.8 F (37.7 C)  100.2 F (37.9 C)  TempSrc:  Oral  Oral  SpO2:  98%  93%  Weight:      Height:        Intake/Output Summary (Last 24 hours) at 12/28/2019 0850 Last data filed at 12/28/2019 0300 Gross per 24 hour  Intake 413.67 ml  Output 1500 ml  Net -1086.33 ml   Filed Weights   12/27/19 0647 12/27/19 0652 12/27/19 1345  Weight: 134.2 kg 134.7 kg 134.2 kg     ECG    Sinus rhythm with RBBB and ST  depressions inferiorly and V2-V6 (most pronounced in V3) with corresponding TWI's - Personally Reviewed  Physical Exam   GEN: No audible distress.   Psych: Normal affect  Neuro: Alert and oriented  Labs    Chemistry Recent Labs  Lab 12/27/19 0749 12/27/19 1152 12/28/19 0505  NA 134* 138 134*  K 6.5* 5.4* 5.4*  CL 96* 100 93*  CO2 21* 23 24  GLUCOSE 327* 96 147*  BUN 58* 59* 43*  CREATININE 11.54* 12.14* 9.71*  CALCIUM 8.6* 8.9 8.9  PROT 7.3  --  7.7  ALBUMIN 3.2* 3.2* 3.4*  AST 21  --  33  ALT 28  --  33  ALKPHOS 118  --  94  BILITOT 0.9  --  0.8  GFRNONAA 4* 4* 5*  GFRAA 5* 5* 6*  ANIONGAP 17* 15 17*     Hematology Recent Labs  Lab 12/27/19 0749 12/27/19 1152 12/28/19 0505  WBC 7.7 8.5 6.6  RBC 3.58* 3.49* 3.52*  HGB 11.5* 11.2* 11.2*  HCT 37.2* 35.6* 35.8*  MCV 103.9* 102.0* 101.7*  MCH 32.1 32.1 31.8  MCHC 30.9 31.5 31.3  RDW 16.8* 16.7* 16.7*  PLT 178 188 181    Cardiac EnzymesNo results for input(s): TROPONINI in the last 168 hours. No results for input(s):  TROPIPOC in the last 168 hours.   BNPNo results for input(s): BNP, PROBNP in the last 168 hours.   DDimer  Recent Labs  Lab 12/27/19 0749  DDIMER 0.62*     Radiology    NM Pulmonary Perfusion  Result Date: 12/27/2019 CLINICAL DATA:  Shortness of breath. EXAM: NUCLEAR MEDICINE PERFUSION LUNG SCAN TECHNIQUE: Perfusion images were obtained in multiple projections after intravenous injection of radiopharmaceutical. Ventilation scans intentionally deferred if perfusion scan and chest x-ray adequate for interpretation during COVID 19 epidemic. RADIOPHARMACEUTICALS:  1.7 mCi Tc-74m MAA IV COMPARISON:  December 27, 2019. FINDINGS: No significant perfusion defect is noted. IMPRESSION: No definite perfusion defect is noted. No definite evidence of pulmonary embolus. Electronically Signed   By: Marijo Conception M.D.   On: 12/27/2019 13:23   DG Chest Portable 1 View  Result Date: 12/27/2019 CLINICAL  DATA:  Follow-up COVID-19 pneumonia. EXAM: PORTABLE CHEST 1 VIEW COMPARISON:  12/09/2019 and earlier. FINDINGS: Cardiac silhouette markedly enlarged, unchanged. Previously identified patchy ground-glass airspace opacities in the lung bases have resolved. Lungs now clear. Pulmonary vascularity normal without evidence of pulmonary edema. No visible pleural effusions. IMPRESSION: Resolution of the previously identified pneumonia involving the lung bases. No acute cardiopulmonary disease currently. Electronically Signed   By: Evangeline Dakin M.D.   On: 12/27/2019 08:06   ECHOCARDIOGRAM LIMITED  Result Date: 12/27/2019   ECHOCARDIOGRAM LIMITED REPORT   Patient Name:   Jesus Williams [facility] Date of Exam: 12/27/2019 Medical Rec #:  [id number]          Height:       67.0 in Accession #:    [id number]         Weight:       297.0 lb Date of Birth:  [birth year]          BSA:          2.39 m Patient Age:    89 years           BP:           136/91 mmHg Patient Gender: M                  HR:           105 bpm. Exam Location:  [facility]  Procedure: 2D Echo, Cardiac Doppler and Color Doppler Indications:    Chest Pain  History:        Patient has no prior history of Echocardiogram examinations.                 ESRD, Covid,Acute respiratory distress,Morbid obesity.  Sonographer:    Alvino Chapel RCS Referring Phys: [id number] [place] D [place]  1. Left ventricular ejection fraction, by visual estimation, is 55 to 60%. The left ventricle has normal function. Unable to assess regional wall motion.  2. Global right ventricle has normal systolc function.The right ventricular size is mildly enlarged. Right ventricular wall thickness was not assessed.  3. Mild mitral annular calcification.  4. The mitral valve is grossly normal. Trivial mitral valve regurgitation.  5. No evidence of aortic valve sclerosis or stenosis.  6. The inferior vena cava is dilated in size with <50% respiratory variability, suggesting right atrial pressure of  15 mmHg.  7. Left ventricular diastolic parameters are consistent with Grade I diastolic dysfunction (impaired relaxation). FINDINGS  Left Ventricle: Left ventricular ejection fraction, by visual estimation, is 55 to 60%. The left ventricle has normal function. The left ventricular internal cavity size  was the left ventricle is normal in size. There is moderately increased left ventricular wall thickness. Concentric left ventricular hypertrophy. Left ventricular diastolic parameters are consistent with Grade I diastolic dysfunction (impaired relaxation). Right Ventricle: The right ventricular size is mildly enlarged. Right vetricular wall thickness was not assessed. Global RV systolic function is has normal systolic function. Left Atrium: Left atrial size was normal in size. Right Atrium: Right atrial size was not well visualized. Right atrial pressure is estimated at 15 mmHg. Pericardium: There is no evidence of pericardial effusion is seen. There is no evidence of pericardial effusion. Mitral Valve: The mitral valve is grossly normal. Mild mitral annular calcification. MV Area by PHT, 3.02 cm. MV PHT, 72.93 msec. Trivial mitral valve regurgitation. Tricuspid Valve: The tricuspid valve is not well visualized. Aortic Valve: The aortic valve is structurally normal, with no evidence of sclerosis or stenosis. Aorta: The aortic root is normal in size and structure. Venous: The inferior vena cava is dilated in size with less than 50% respiratory variability, suggesting right atrial pressure of 15 mmHg.  LEFT VENTRICLE          Normals PLAX 2D LVIDd:         4.85 cm  3.6 cm   Diastology                  Normals LVIDs:         3.36 cm  1.7 cm   LV e' lateral:   10.30 cm/s 6.42 cm/s LV PW:         1.43 cm  1.4 cm   LV E/e' lateral: 5.4        15.4 LV IVS:        1.42 cm  1.3 cm   LV e' medial:    5.87 cm/s  6.96 cm/s LVOT diam:     1.90 cm  2.0 cm   LV E/e' medial:  9.5        6.96 LV SV:         64 ml    79 ml LV SV  Index:   24.66    45 ml/m2 LVOT Area:     2.84 cm 3.14 cm2  RIGHT VENTRICLE RV S prime:     11.10 cm/s TAPSE (M-mode): 1.1 cm LEFT ATRIUM         Index LA diam:    2.90 cm 1.21 cm/m   AORTA                 Normals Ao Root diam: 3.05 cm 31 mm MITRAL VALVE              Normals MV Area (PHT): 3.02 cm             SHUNTS MV PHT:        72.93 msec 55 ms     Systemic Diam: 1.90 cm MV Decel Time: 252 msec   187 ms MV E velocity: 55.55 cm/s 103 cm/s MV A velocity: 66.70 cm/s 70.3 cm/s MV E/A ratio:  0.83       1.5  Kate Sable MD Electronically signed by Kate Sable MD Signature Date/Time: 12/27/2019/3:23:43 PMThe mitral valve is grossly normal.    Final      Patient Profile     59 y.o. male with a hx of recent Covid-19 PNA, morbid obesity, OSA (intolerant of CPAP), ESRD on HD x 3 years, hyponatremia, gouty arthritis, HLD, chronic respiratory failure on home O2, DM (A1C  7.0 12/07/19), RBBB, mild anemia (during recent admit) who is being seen today for the evaluation of shortness of breath, abnormal EKG/troponin at the request of Dr. Regenia Skeeter.  Assessment & Plan    1. NSTEMI: Troponins continue to rise (most recent 6.317). Asymptomatic at rest. LV systolic function normal by echo on 1/18, LVEF 55-60%. Currently on IV heparin, ASA, carvedilol, and atorvastatin. Plan is to transfer to [facility] for coronary angiography. He is agreeable to proceed. I have spoken with Dr. Irish Lack as well. Risks and benefits of cardiac catheterization have been discussed with the patient.  These include bleeding, infection, kidney damage, stroke, heart attack, death.  The patient understands these risks and is willing to proceed.  2. ESRD on HD: Dialyzed yesterday. Per Nephrology.  3. Morbid obesity: Once recovered would benefit from lifestyle management. Has not been able to tolerate CPAP because it makes him feel worse.  4. DM: per IM.  5. OSA: Intolerant of CPAP.   For questions or updates, please contact  [place] Please consult [email or web address] for contact info under Cardiology/STEMI.      Signed, Kate Sable, MD  12/28/2019, 8:50 AM

## 2019-12-29 DIAGNOSIS — I400 Infective myocarditis: Principal | ICD-10-CM

## 2019-12-29 DIAGNOSIS — J1282 Pneumonia due to coronavirus disease 2019: Secondary | ICD-10-CM

## 2019-12-29 DIAGNOSIS — I10 Essential (primary) hypertension: Secondary | ICD-10-CM

## 2019-12-29 DIAGNOSIS — U071 COVID-19: Secondary | ICD-10-CM

## 2019-12-29 LAB — CBC
HCT: 32 % — ABNORMAL LOW (ref 39.0–52.0)
HCT: 34.8 % — ABNORMAL LOW (ref 39.0–52.0)
Hemoglobin: 10.3 g/dL — ABNORMAL LOW (ref 13.0–17.0)
Hemoglobin: 11.3 g/dL — ABNORMAL LOW (ref 13.0–17.0)
MCH: 31.8 pg (ref 26.0–34.0)
MCH: 31.9 pg (ref 26.0–34.0)
MCHC: 32.2 g/dL (ref 30.0–36.0)
MCHC: 32.5 g/dL (ref 30.0–36.0)
MCV: 98.8 fL (ref 80.0–100.0)
MCV: 98.3 fL (ref 80.0–100.0)
Platelets: 164 10*3/uL (ref 150–400)
Platelets: 151 10*3/uL (ref 150–400)
RBC: 3.24 MIL/uL — ABNORMAL LOW (ref 4.22–5.81)
RBC: 3.54 MIL/uL — ABNORMAL LOW (ref 4.22–5.81)
RDW: 16.7 % — ABNORMAL HIGH (ref 11.5–15.5)
RDW: 16.5 % — ABNORMAL HIGH (ref 11.5–15.5)
WBC: 5.7 10*3/uL (ref 4.0–10.5)
WBC: 4.9 10*3/uL (ref 4.0–10.5)
nRBC: 0.4 % — ABNORMAL HIGH (ref 0.0–0.2)
nRBC: 0 % (ref 0.0–0.2)

## 2019-12-29 LAB — HEPATITIS PANEL, ACUTE
HCV Ab: NONREACTIVE
Hep A IgM: NONREACTIVE
Hep B C IgM: NONREACTIVE
Hepatitis B Surface Ag: NONREACTIVE

## 2019-12-29 LAB — BASIC METABOLIC PANEL
Anion gap: 18 — ABNORMAL HIGH (ref 5–15)
BUN: 64 mg/dL — ABNORMAL HIGH (ref 6–20)
CO2: 22 mmol/L (ref 22–32)
Calcium: 9 mg/dL (ref 8.9–10.3)
Chloride: 91 mmol/L — ABNORMAL LOW (ref 98–111)
Creatinine, Ser: 12.75 mg/dL — ABNORMAL HIGH (ref 0.61–1.24)
GFR calc Af Amer: 4 mL/min — ABNORMAL LOW (ref >60)
GFR calc non Af Amer: 4 mL/min — ABNORMAL LOW (ref >60)
Glucose, Bld: 136 mg/dL — ABNORMAL HIGH (ref 70–99)
Potassium: 5.7 mmol/L — ABNORMAL HIGH (ref 3.5–5.1)
Sodium: 131 mmol/L — ABNORMAL LOW (ref 135–145)

## 2019-12-29 LAB — MAGNESIUM: Magnesium: 2 mg/dL (ref 1.7–2.4)

## 2019-12-29 LAB — GLUCOSE, CAPILLARY
Glucose-Capillary: 132 mg/dL — ABNORMAL HIGH (ref 70–99)
Glucose-Capillary: 182 mg/dL — ABNORMAL HIGH (ref 70–99)
Glucose-Capillary: 96 mg/dL (ref 70–99)

## 2019-12-29 LAB — PHOSPHORUS: Phosphorus: 7 mg/dL — ABNORMAL HIGH (ref 2.5–4.6)

## 2019-12-29 LAB — HEPATITIS B CORE ANTIBODY, TOTAL: Hep B Core Total Ab: NONREACTIVE

## 2019-12-29 MED ORDER — SODIUM CHLORIDE 0.9 % IV SOLN: 100.0000 mL | INTRAVENOUS | Status: DC | PRN

## 2019-12-29 MED ORDER — LIDOCAINE-PRILOCAINE 2.5-2.5 % EX CREA: 1.0000 "application " | TOPICAL_CREAM | CUTANEOUS | Status: DC | PRN

## 2019-12-29 MED ORDER — PENTAFLUOROPROP-TETRAFLUOROETH EX AERO: 1.0000 "application " | INHALATION_SPRAY | CUTANEOUS | Status: DC | PRN

## 2019-12-29 MED ORDER — LIDOCAINE HCL (PF) 1 % IJ SOLN
5.0000 mL | INTRAMUSCULAR | Status: DC | PRN
Start: 2019-12-29 — End: 2019-12-29

## 2019-12-29 MED ORDER — DOXERCALCIFEROL 4 MCG/2ML IV SOLN
INTRAVENOUS | Status: AC
  Administered 2019-12-29: 3.5 ug via INTRAVENOUS
  Filled 2019-12-29: qty 2

## 2019-12-29 MED ORDER — ALTEPLASE 2 MG IJ SOLR: 2.0000 mg | Freq: Once | INTRAMUSCULAR | Status: DC | PRN

## 2019-12-29 MED ORDER — HEPARIN SODIUM (PORCINE) 1000 UNIT/ML DIALYSIS: 1000.0000 [IU] | INTRAMUSCULAR | Status: DC | PRN

## 2019-12-29 MED ORDER — HEPARIN SODIUM (PORCINE) 1000 UNIT/ML IJ SOLN
INTRAMUSCULAR | Status: AC
  Filled 2019-12-29: qty 1

## 2019-12-29 MED ORDER — DOXERCALCIFEROL 4 MCG/2ML IV SOLN
3.5000 ug | INTRAVENOUS | Status: DC
  Filled 2019-12-29: qty 2

## 2019-12-29 NOTE — Progress Notes (Signed)
RN reviewed discharge instructions with patient.  Patient did not have any questions at time of discharge.  RN notified telemetry that patient was being discharged.  RN wheeled patient to main entrance in wheelchair and patient got into private car driven by his sister.

## 2019-12-29 NOTE — TOC Initial Note (Signed)
Transition of Care [facility]) - Initial/Assessment Note    Patient Details  Name: Jesus Williams MRN: [id number] Date of Birth: February 22, 1961  [facility] CM/SW Contact:    Bethena Roys, RN Phone Number: 12/29/2019, 1:50 PM  Clinical Narrative: Pt presented for shortness of breath and chest pain. Prior to arrival patient was from home with support of mother. Patient is a hemodialysis patient MWF at [facility]- patient drives himself to dialysis and he uses [facility] for new medications. He usually gets most of his medications filled via the [facility]. Patient states with the [facility] he has no cost with medications. Case Manager did call the Fees Coordinator to make them aware of hospitalization. Patient's provider is in the [place] NP- fax # is [phone].  Discharge summary will need to be faxed to his provider at the [facility]. No further home needs identified.                Expected Discharge Plan: Home/Self Care Barriers to Discharge: No Barriers Identified   Patient Goals and CMS Choice Patient states their goals for this hospitalization and ongoing recovery are:: "to return home"   Choice offered to / list presented to : NA  Expected Discharge Plan and Services Expected Discharge Plan: Home/Self Care In-house Referral: NA Discharge Planning Services: CM Consult Post Acute Care Choice: NA Living arrangements for the past 2 months: Single Family Home                  Prior Living Arrangements/Services Living arrangements for the past 2 months: Single Family Home Lives with:: Parents Patient language and need for interpreter reviewed:: Yes Do you feel safe going back to the place where you live?: Yes      Need for Family Participation in Patient Care: Yes (Comment) Care giver support system in place?: Yes (comment)   Criminal Activity/Legal Involvement Pertinent to Current Situation/Hospitalization: No - Comment as  needed  Activities of Daily Living Home Assistive Devices/Equipment: Oxygen ADL Screening (condition at time of admission) Patient's cognitive ability adequate to safely complete daily activities?: Yes Is the patient deaf or have difficulty hearing?: No Does the patient have difficulty seeing, even when wearing glasses/contacts?: No Does the patient have difficulty concentrating, remembering, or making decisions?: No Patient able to express need for assistance with ADLs?: Yes Does the patient have difficulty dressing or bathing?: No Independently performs ADLs?: Yes (appropriate for developmental age) Does the patient have difficulty walking or climbing stairs?: No Weakness of Legs: None Weakness of Arms/Hands: None  Permission Sought/Granted Permission sought to share information with : Family Supports, Chartered certified accountant granted to share information with : Yes, Verbal Permission Granted     Permission granted to share info w AGENCY: [facility]-        Emotional Assessment Appearance:: Appears stated age Attitude/Demeanor/Rapport: Engaged Affect (typically observed): Appropriate Orientation: : Oriented to Situation, Oriented to  Time, Oriented to Place, Oriented to Self Alcohol / Substance Use: Not Applicable Psych Involvement: No (comment)  Admission diagnosis:  Dyspnea [R06.00] Exertional dyspnea [R06.00] Patient Active Problem List   Diagnosis Date Noted  . NSTEMI (non-ST elevated myocardial infarction) ([place]) 12/28/2019  . Morbid obesity ([place])   . OSA (obstructive sleep apnea)   . Elevated troponin   . Dyspnea 12/27/2019  . ESRD (end stage renal disease) ([place]) 12/11/2019  . Pneumonia due to COVID-19 virus   . SIRS (systemic inflammatory response  syndrome) ([place]) 12/07/2019  . Type 2 diabetes mellitus ([place]) 12/07/2019  . COVID-19 12/07/2019  . Acute respiratory failure with hypoxia ([place]) 12/07/2019  . Hyperlipidemia   . Gout   . Hypertension    . Hyponatremia   . Anemia   . Leukopenia   . Acute respiratory distress    PCP:  System, Provider Not In Pharmacy:   [id number] - [place], [place] [address] [place] [address] Phone: [phone] Fax: [phone]     Social Determinants of Health (SDOH) Interventions    Readmission Risk Interventions Readmission Risk Prevention Plan 12/29/2019  Transportation Screening Complete  HRI or Home Care Consult Complete  Social Work Consult for [place] Planning/Counseling Complete  Palliative Care Screening Not Applicable  Medication Review Press photographer) Complete  Some recent data might be hidden

## 2019-12-29 NOTE — Discharge Instructions (Signed)

## 2019-12-29 NOTE — Discharge Summary (Addendum)
Physician Discharge Summary  Jesus Williams [id number] DOB: [birth year] DOA: 12/27/2019  PCP: System, Provider Not In  Admit date: 12/27/2019 Discharge date: 12/29/2019 Consultations: Cardiology Dr Gena Fray. Nephrology Dr Moshe Cipro Admitted From: home Disposition: home  Discharge Diagnoses:  Principal Problem:   Pneumonia due to COVID-19 virus Active Problems:   Hypertension   ESRD (end stage renal disease) ([place])   Dyspnea   NSTEMI (non-ST elevated myocardial infarction) ([place])   Morbid obesity (H[facility])   OSA (obstructive sleep apnea)   Elevated troponin  Hospital Course Summary: 59 year old with history of ESRD on Monday Wednesday Friday HD schedule, HTN, morbid obesity with OSA, and DM2 admitted on 12/27/2019 with worsening dyspnea and finding of NSTEMI in the context of COVID-19 respiratory virus infection with patient hospitalization for Covid pneumonia from 12/06/2019 to 12/11/2019 -Troponin continued to trend up despite medical management and IV heparin and hemodialysis -Transferred to [facility] for left heart cath and further cardiovascular evaluation under hospitalist service with cardiology consult -Underwent cardiac cath by cardiology on 1/19 which showed nonobstructive CAD   1.  Dyspnea/troponinemia: Initially suspected to have non-STEMI.  Underwent cardiac cath which showed nonobstructive CAD and no ischemic reason for elevated troponin.  More likely to be related to recent COVID-19 infection as described below.  Echo with normal EF and no evidence of new cardiomyopathy.  Cardiology recommended resuming home medications including beta-blockers.  2. 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  Patient feels better after dialysis.  Denies any chest pain.  Never been hypoxic.  Echo normal.  Seen by cardiology and role of steroids contemplated given uptrending troponin  levels.  However Dr. Tamala Julian followed up today and recommended no additional interventions except for follow-up echo in 3 months  3.  End-stage renal disease: Seen by nephrology and underwent dialysis as scheduled today.  He has been set up with outpatient dialysis in [place] (see nephrology note from today for outpatient dialysis orders)  4.  Anemia of chronic disease: Stable.  5.  Diabetes mellitus: Resume home insulin regimen.       Discharge Exam:  Vitals:   12/29/19 1724 12/29/19 1827  BP: 105/62 (!) 107/59  Pulse: 74 95  Resp: 18 18  Temp: 98.2 F (36.8 C) 99.2 F (37.3 C)  SpO2: 100% 100%   Vitals:   12/29/19 1630 12/29/19 1700 12/29/19 1724 12/29/19 1827  BP: 102/60 (!) 117/91 105/62 (!) 107/59  Pulse: 93 66 74 95  Resp:   18 18  Temp:   98.2 F (36.8 C) 99.2 F (37.3 C)  TempSrc:   Oral Oral  SpO2:   100% 100%  Weight:   130 kg   Height:        General: Pt is alert, awake, not in acute distress Cardiovascular: RRR, S1/S2 +, no rubs, no gallops Respiratory: CTA bilaterally, no wheezing, no rhonchi Abdominal: Soft, NT, ND, bowel sounds + Extremities: no edema, no cyanosis  Discharge Condition:Stable CODE STATUS: Full code Diet recommendation: heart healthy diabetic diet Recommendations for Outpatient Follow-up:  1. Follow up with PCP: 1 week 2. Follow up with consultants: Cardiology Dr Lorelee New in 2 weeks 3. Please obtain follow up labs including: Troponin level IN 1 week, Echo in 2 months  Home Health services upon discharge:  Equipment/Devices upon discharge:   Discharge Instructions:  Discharge Instructions    Call MD for:   Complete by: As directed    Chest  pain, dyspnea, leg edema   Call MD for:  difficulty breathing, headache or visual disturbances   Complete by: As directed    Call MD for:  persistant dizziness or light-headedness   Complete by: As directed    Call MD for:  persistant nausea and vomiting   Complete by: As directed     Call MD for:  temperature >100.4   Complete by: As directed    Diet - low sodium heart healthy   Complete by: As directed    Increase activity slowly   Complete by: As directed      Allergies as of 12/29/2019      Reactions   No Known Allergies       Medication List    STOP taking these medications   dexamethasone 6 MG tablet Commonly known as: DECADRON     TAKE these medications   albuterol 108 (90 Base) MCG/ACT inhaler Commonly known as: VENTOLIN HFA Inhale 2 puffs into the lungs every 6 (six) hours as needed for wheezing or shortness of breath.   allopurinol 100 MG tablet Commonly known as: ZYLOPRIM Take 100 mg by mouth daily.   ascorbic acid 500 MG tablet Commonly known as: VITAMIN C Take 1 tablet (500 mg total) by mouth daily. X 5 days   aspirin 81 MG chewable tablet Chew 1 tablet by mouth daily.   atorvastatin 80 MG tablet Commonly known as: LIPITOR Take 1 tablet by mouth daily.   carvedilol 25 MG tablet Commonly known as: COREG Take 25 mg by mouth 2 (two) times daily with a meal.   NovoLOG Mix 70/30 (70-30) 100 UNIT/ML injection Generic drug: insulin aspart protamine- aspart Inject 42 Units into the skin 2 (two) times daily.   sevelamer carbonate 800 MG tablet Commonly known as: RENVELA Take 800 mg by mouth 3 (three) times daily with meals.   zinc sulfate 220 (50 Zn) MG capsule Take 1 capsule (220 mg total) by mouth daily. X 5 days       Allergies  Allergen Reactions  . No Known Allergies       The results of significant diagnostics from this hospitalization (including imaging, microbiology, ancillary and laboratory) are listed below for reference.    Labs: BNP (last 3 results) No results for input(s): BNP in the last 8760 hours. Basic Metabolic Panel: Recent Labs  Lab 12/27/19 0749 12/27/19 1152 12/28/19 0505 12/29/19 0912  NA 134* 138 134* 131*  K 6.5* 5.4* 5.4* 5.7*  CL 96* 100 93* 91*  CO2 21* 23 24 22   GLUCOSE 327* 96 147*  136*  BUN 58* 59* 43* 64*  CREATININE 11.54* 12.14* 9.71* 12.75*  CALCIUM 8.6* 8.9 8.9 9.0  MG  --   --  1.7 2.0  PHOS  --  5.9*  --  7.0*   Liver Function Tests: Recent Labs  Lab 12/27/19 0749 12/27/19 1152 12/28/19 0505  AST 21  --  33  ALT 28  --  33  ALKPHOS 118  --  94  BILITOT 0.9  --  0.8  PROT 7.3  --  7.7  ALBUMIN 3.2* 3.2* 3.4*   No results for input(s): LIPASE, AMYLASE in the last 168 hours. No results for input(s): AMMONIA in the last 168 hours. CBC: Recent Labs  Lab 12/27/19 0749 12/27/19 1152 12/28/19 0505 12/29/19 0235 12/29/19 1850  WBC 7.7 8.5 6.6 5.7 4.9  NEUTROABS 5.4  --   --   --   --   HGB  11.5* 11.2* 11.2* 10.3* 11.3*  HCT 37.2* 35.6* 35.8* 32.0* 34.8*  MCV 103.9* 102.0* 101.7* 98.8 98.3  PLT 178 188 181 164 151   Cardiac Enzymes: No results for input(s): CKTOTAL, CKMB, CKMBINDEX, TROPONINI in the last 168 hours. BNP: Invalid input(s): POCBNP CBG: Recent Labs  Lab 12/28/19 1700 12/28/19 2054 12/29/19 0751 12/29/19 1119 12/29/19 1824  GLUCAP 94 178* 132* 182* 96   D-Dimer Recent Labs    12/27/19 0749  DDIMER 0.62*   Hgb A1c No results for input(s): HGBA1C in the last 72 hours. Lipid Profile Recent Labs    12/28/19 0505  CHOL 84  HDL 32*  LDLCALC 32  TRIG 98  CHOLHDL 2.6   Thyroid function studies No results for input(s): TSH, T4TOTAL, T3FREE, THYROIDAB in the last 72 hours.  Invalid input(s): FREET3 Anemia work up No results for input(s): VITAMINB12, FOLATE, FERRITIN, TIBC, IRON, RETICCTPCT in the last 72 hours. Urinalysis No results found for: COLORURINE, APPEARANCEUR, LABSPEC, [place], GLUCOSEU, HGBUR, BILIRUBINUR, KETONESUR, PROTEINUR, UROBILINOGEN, NITRITE, LEUKOCYTESUR Sepsis Labs Invalid input(s): PROCALCITONIN,  WBC,  LACTICIDVEN Microbiology No results found for this or any previous visit (from the past 240 hour(s)).  Procedures/Studies: NM Pulmonary Perfusion  Result Date: 12/27/2019 CLINICAL DATA:   Shortness of breath. EXAM: NUCLEAR MEDICINE PERFUSION LUNG SCAN TECHNIQUE: Perfusion images were obtained in multiple projections after intravenous injection of radiopharmaceutical. Ventilation scans intentionally deferred if perfusion scan and chest x-ray adequate for interpretation during COVID 19 epidemic. RADIOPHARMACEUTICALS:  1.7 mCi Tc-43m MAA IV COMPARISON:  December 27, 2019. FINDINGS: No significant perfusion defect is noted. IMPRESSION: No definite perfusion defect is noted. No definite evidence of pulmonary embolus. Electronically Signed   By: Marijo Conception M.D.   On: 12/27/2019 13:23   CARDIAC CATHETERIZATION  Addendum Date: 12/28/2019    Mid RCA to Dist RCA lesion is 25% stenosed.  Mid LAD lesion is 25% stenosed.  2nd Diag lesion is 25% stenosed.  LV end diastolic pressure is normal.  There is no aortic valve stenosis.  Nonobstructive CAD.  Continue aggressive preventive therapy. Possible myocarditis as cause of elevated troponin in the setting of COVID.    Result Date: 12/28/2019  Mid RCA to Dist RCA lesion is 25% stenosed.  Mid LAD lesion is 25% stenosed.  2nd Diag lesion is 25% stenosed.  LV end diastolic pressure is normal.  There is no aortic valve stenosis.  No significant CAD.  Possible myocarditis as cause in the setting of COVID.    DG Chest Portable 1 View  Result Date: 12/27/2019 CLINICAL DATA:  Follow-up COVID-19 pneumonia. EXAM: PORTABLE CHEST 1 VIEW COMPARISON:  12/09/2019 and earlier. FINDINGS: Cardiac silhouette markedly enlarged, unchanged. Previously identified patchy ground-glass airspace opacities in the lung bases have resolved. Lungs now clear. Pulmonary vascularity normal without evidence of pulmonary edema. No visible pleural effusions. IMPRESSION: Resolution of the previously identified pneumonia involving the lung bases. No acute cardiopulmonary disease currently. Electronically Signed   By: Evangeline Dakin M.D.   On: 12/27/2019 08:06   DG CHEST PORT 1  VIEW  Result Date: 12/09/2019 CLINICAL DATA:  COVID-19 pneumonia EXAM: PORTABLE CHEST 1 VIEW COMPARISON:  Two days ago FINDINGS: Cardiomegaly. Improved aeration. There is no edema, consolidation, effusion, or pneumothorax. IMPRESSION: 1. Improved aeration. 2. Cardiomegaly without failure. Electronically Signed   By: Monte Fantasia M.D.   On: 12/09/2019 07:15   DG CHEST PORT 1 VIEW  Result Date: 12/07/2019 CLINICAL DATA:  COVID positive EXAM: PORTABLE CHEST 1 VIEW COMPARISON:  12/06/2019 FINDINGS: Cardiomegaly. There may be subtle heterogeneous airspace opacity in the bilateral lung bases. Osseous structures are unremarkable. IMPRESSION: 1. There may be subtle heterogeneous airspace opacity in the bilateral lung bases, generally in keeping with COVID-19 airspace disease. This appears new or increased compared to prior radiographs. PA and lateral radiographs or CT may be helpful to further evaluate. 2.  Cardiomegaly. Electronically Signed   By: Eddie Candle M.D.   On: 12/07/2019 11:20   DG Chest Port 1 View  Result Date: 12/06/2019 CLINICAL DATA:  Shortness of breath EXAM: PORTABLE CHEST 1 VIEW COMPARISON:  04/23/2013 FINDINGS: Cardiomegaly with mild central congestion. No pleural effusion or overt pulmonary edema. No pneumothorax. IMPRESSION: Cardiomegaly with mild central congestion. No definite focal airspace disease. Electronically Signed   By: Donavan Foil M.D.   On: 12/06/2019 22:39   ECHOCARDIOGRAM LIMITED  Result Date: 12/27/2019   ECHOCARDIOGRAM LIMITED REPORT   Patient Name:   Jesus Williams [facility] Date of Exam: 12/27/2019 Medical Rec #:  [id number]          Height:       67.0 in Accession #:    [id number]         Weight:       297.0 lb Date of Birth:  06-17-1961          BSA:          2.39 m Patient Age:    35 years           BP:           136/91 mmHg Patient Gender: M                  HR:           105 bpm. Exam Location:  [facility]  Procedure: 2D Echo, Cardiac Doppler and Color Doppler  Indications:    Chest Pain  History:        Patient has no prior history of Echocardiogram examinations.                 ESRD, Covid,Acute respiratory distress,Morbid obesity.  Sonographer:    Alvino Chapel RCS Referring Phys: [id number] [place] D Callao  1. Left ventricular ejection fraction, by visual estimation, is 55 to 60%. The left ventricle has normal function. Unable to assess regional wall motion.  2. Global right ventricle has normal systolc function.The right ventricular size is mildly enlarged. Right ventricular wall thickness was not assessed.  3. Mild mitral annular calcification.  4. The mitral valve is grossly normal. Trivial mitral valve regurgitation.  5. No evidence of aortic valve sclerosis or stenosis.  6. The inferior vena cava is dilated in size with <50% respiratory variability, suggesting right atrial pressure of 15 mmHg.  7. Left ventricular diastolic parameters are consistent with Grade I diastolic dysfunction (impaired relaxation). FINDINGS  Left Ventricle: Left ventricular ejection fraction, by visual estimation, is 55 to 60%. The left ventricle has normal function. The left ventricular internal cavity size was the left ventricle is normal in size. There is moderately increased left ventricular wall thickness. Concentric left ventricular hypertrophy. Left ventricular diastolic parameters are consistent with Grade I diastolic dysfunction (impaired relaxation). Right Ventricle: The right ventricular size is mildly enlarged. Right vetricular wall thickness was not assessed. Global RV systolic function is has normal systolic function. Left Atrium: Left atrial size was normal in size. Right Atrium: Right atrial size was not well visualized. Right atrial pressure is estimated at 15 mmHg.  Pericardium: There is no evidence of pericardial effusion is seen. There is no evidence of pericardial effusion. Mitral Valve: The mitral valve is grossly normal. Mild mitral annular calcification. MV  Area by PHT, 3.02 cm. MV PHT, 72.93 msec. Trivial mitral valve regurgitation. Tricuspid Valve: The tricuspid valve is not well visualized. Aortic Valve: The aortic valve is structurally normal, with no evidence of sclerosis or stenosis. Aorta: The aortic root is normal in size and structure. Venous: The inferior vena cava is dilated in size with less than 50% respiratory variability, suggesting right atrial pressure of 15 mmHg.  LEFT VENTRICLE          Normals PLAX 2D LVIDd:         4.85 cm  3.6 cm   Diastology                  Normals LVIDs:         3.36 cm  1.7 cm   LV e' lateral:   10.30 cm/s 6.42 cm/s LV PW:         1.43 cm  1.4 cm   LV E/e' lateral: 5.4        15.4 LV IVS:        1.42 cm  1.3 cm   LV e' medial:    5.87 cm/s  6.96 cm/s LVOT diam:     1.90 cm  2.0 cm   LV E/e' medial:  9.5        6.96 LV SV:         64 ml    79 ml LV SV Index:   24.66    45 ml/m2 LVOT Area:     2.84 cm 3.14 cm2  RIGHT VENTRICLE RV S prime:     11.10 cm/s TAPSE (M-mode): 1.1 cm LEFT ATRIUM         Index LA diam:    2.90 cm 1.21 cm/m   AORTA                 Normals Ao Root diam: 3.05 cm 31 mm MITRAL VALVE              Normals MV Area (PHT): 3.02 cm             SHUNTS MV PHT:        72.93 msec 55 ms     Systemic Diam: 1.90 cm MV Decel Time: 252 msec   187 ms MV E velocity: 55.55 cm/s 103 cm/s MV A velocity: 66.70 cm/s 70.3 cm/s MV E/A ratio:  0.83       1.5  Kate Sable MD Electronically signed by Kate Sable MD Signature Date/Time: 12/27/2019/3:23:43 PMThe mitral valve is grossly normal.    Final      Time coordinating discharge: Over 30 minutes  SIGNED:   Guilford Shi, MD  [facility] 12/29/2019, 7:19 PM Pager : [phone]

## 2019-12-29 NOTE — Progress Notes (Signed)
[facility] ROUNDING NOTE   Subjective:   59 year old gentleman with a history of recent Covid , ESRD Monday Wednesday Friday [facility]- previously [facility] in [place] due to covid pos status but due back in [place].  hospitalization from 12/06/2019 to 12/11/2019 with Covid. Now admitted with SOB, pos trop-  S/p cardiac cath - [facility]  Outpatient dialysis prescription is as follows  Outpatient dialysis orders MWF 4 hours and 25minutes  2K/2.5 calcium bath EDW is 132 kg  Heparin 3000 units load and hourly dose of 1200 units stop 1 hour before end of dialysis epogen 1200 units IVP three times weekly  hectorol 3.5 mcg three times weekly  venofer 50 mg once weekly  Due for HD today-  Seen coming back from [place]-  Seems SOB and is sweating-  Better after resting on bed   Objective:  Vital signs in last 24 hours:  Temp:  [98.2 F (36.8 C)-99.6 F (37.6 C)] 98.6 F (37 C) (01/20 0453) Pulse Rate:  [94-105] 94 (01/20 0512) Resp:  [9-21] 9 (01/19 1608) BP: (93-159)/(60-102) 108/74 (01/20 0512) SpO2:  [91 %-99 %] 99 % (01/20 0453) Weight:  [132.6 kg-134.3 kg] 132.6 kg (01/20 0453)  Weight change: 0.065 kg Filed Weights   12/27/19 1345 12/28/19 1700 12/29/19 0453  Weight: 134.2 kg 134.3 kg 132.6 kg    Intake/Output: I/O last 3 completed shifts: In: 533.7 [P.O.:360; I.V.:173.7] Out: -    Intake/Output this shift:  No intake/output data recorded. Alert and oriented  CVS- RRR JVP not elevated RS- CTA with occasional lower lobe rales ABD- BS present so ft non-distended EXT- no edema left forearm fistula   Basic Metabolic Panel: Recent Labs  Lab 12/27/19 0749 12/27/19 1152 12/28/19 0505  NA 134* 138 134*  K 6.5* 5.4* 5.4*  CL 96* 100 93*  CO2 21* 23 24  GLUCOSE 327* 96 147*  BUN 58* 59* 43*  CREATININE 11.54* 12.14* 9.71*  CALCIUM 8.6* 8.9 8.9  MG  --   --  1.7  PHOS  --  5.9*  --     Liver Function Tests: Recent Labs  Lab 12/27/19 0749  12/27/19 1152 12/28/19 0505  AST 21  --  33  ALT 28  --  33  ALKPHOS 118  --  94  BILITOT 0.9  --  0.8  PROT 7.3  --  7.7  ALBUMIN 3.2* 3.2* 3.4*   No results for input(s): LIPASE, AMYLASE in the last 168 hours. No results for input(s): AMMONIA in the last 168 hours.  CBC: Recent Labs  Lab 12/27/19 0749 12/27/19 1152 12/28/19 0505 12/29/19 0235  WBC 7.7 8.5 6.6 5.7  NEUTROABS 5.4  --   --   --   HGB 11.5* 11.2* 11.2* 10.3*  HCT 37.2* 35.6* 35.8* 32.0*  MCV 103.9* 102.0* 101.7* 98.8  PLT 178 188 181 164    Cardiac Enzymes: No results for input(s): CKTOTAL, CKMB, CKMBINDEX, TROPONINI in the last 168 hours.  BNP: Invalid input(s): POCBNP  CBG: Recent Labs  Lab 12/28/19 0753 12/28/19 1131 12/28/19 1700 12/28/19 2054 12/29/19 0751  GLUCAP 118* 125* 94 178* 132*    Microbiology: Results for orders placed or performed during the hospital encounter of 12/06/19  SARS CORONAVIRUS 2 (TAT 6-24 HRS) Nasopharyngeal Nasopharyngeal Swab     Status: Abnormal   Collection Time: 12/06/19 10:35 PM   Specimen: Nasopharyngeal Swab  Result Value Ref Range Status   SARS Coronavirus 2 POSITIVE (A) NEGATIVE Final  Comment: RESULT CALLED TO, READ BACK BY AND VERIFIED WITH: Jani Gravel RN 13:30 12/07/19 ([email or web address]) (NOTE) SARS-CoV-2 target nucleic acids are DETECTED. The SARS-CoV-2 RNA is generally detectable in upper and lower respiratory specimens during the acute phase of infection. Positive results are indicative of the presence of SARS-CoV-2 RNA. Clinical correlation with patient history and other diagnostic information is  necessary to determine patient infection status. Positive results do not rule out bacterial infection or co-infection with other viruses.  The expected result is Negative. Fact Sheet for Patients: SugarRoll.be Fact Sheet for Healthcare Providers: https://www.woods-mathews.com/ This test is not yet approved or  cleared by the [place] FDA and  has been authorized for detection and/or diagnosis of SARS-CoV-2 by FDA under an Emergency Use Authorization (EUA). This EUA will remain  in effect (meaning this test can be used) for t he duration of the COVID-19 declaration under Section 564(b)(1) of the Act, 21 U.S.C. section 360bbb-3(b)(1), unless the authorization is terminated or revoked sooner. Performed at [facility], [place] [address], [place], [place] [address]   Blood Culture (routine x 2)     Status: None   Collection Time: 12/06/19 10:35 PM   Specimen: Right Antecubital; Blood  Result Value Ref Range Status   Specimen Description RIGHT ANTECUBITAL  Final   Special Requests   Final    BOTTLES DRAWN AEROBIC AND ANAEROBIC Blood Culture adequate volume   Culture   Final    NO GROWTH 5 DAYS Performed at [facility], [address], [place], [place] [address]    Report Status 12/11/2019 FINAL  Final  Blood Culture (routine x 2)     Status: None   Collection Time: 12/06/19 11:11 PM   Specimen: BLOOD RIGHT HAND  Result Value Ref Range Status   Specimen Description BLOOD RIGHT HAND  Final   Special Requests   Final    BOTTLES DRAWN AEROBIC AND ANAEROBIC Blood Culture adequate volume   Culture   Final    NO GROWTH 5 DAYS Performed at [facility], [address] [place], [place] [address]    Report Status 12/11/2019 FINAL  Final  Respiratory Panel by RT PCR (Flu A&B, Covid) - Nasopharyngeal Swab     Status: Abnormal   Collection Time: 12/07/19  4:54 AM   Specimen: Nasopharyngeal Swab  Result Value Ref Range Status   SARS Coronavirus 2 by RT PCR POSITIVE (A) NEGATIVE Final    Comment: RESULT CALLED TO, READ BACK BY AND VERIFIED WITH: SHORE L. AT 0734A ON [id number] BY THOMPSON S.    Influenza A by PCR NEGATIVE NEGATIVE Final   Influenza B by PCR NEGATIVE NEGATIVE Final    Comment: (NOTE) The Xpert Xpress SARS-CoV-2/FLU/RSV assay is intended as an aid in  the diagnosis of influenza  from Nasopharyngeal swab specimens and  should not be used as a sole basis for treatment. Nasal washings and  aspirates are unacceptable for Xpert Xpress SARS-CoV-2/FLU/RSV  testing. Fact Sheet for Patients: PinkCheek.be Fact Sheet for Healthcare Providers: GravelBags.it This test is not yet approved or cleared by the [place] FDA and  has been authorized for detection and/or diagnosis of SARS-CoV-2 by  FDA under an Emergency Use Authorization (EUA). This EUA will remain  in effect (meaning this test can be used) for the duration of the  Covid-19 declaration under Section 564(b)(1) of the Act, 21  U.S.C. section 360bbb-3(b)(1), unless the authorization is  terminated or revoked. Performed at [facility], [address]  [place], [place] [address]     Coagulation Studies: No results for input(s): LABPROT, INR in the last 72 hours.  Urinalysis: No results for input(s): COLORURINE, LABSPEC, PHURINE, GLUCOSEU, HGBUR, BILIRUBINUR, KETONESUR, PROTEINUR, UROBILINOGEN, NITRITE, LEUKOCYTESUR in the last 72 hours.  Invalid input(s): APPERANCEUR    Imaging: NM Pulmonary Perfusion  Result Date: 12/27/2019 CLINICAL DATA:  Shortness of breath. EXAM: NUCLEAR MEDICINE PERFUSION LUNG SCAN TECHNIQUE: Perfusion images were obtained in multiple projections after intravenous injection of radiopharmaceutical. Ventilation scans intentionally deferred if perfusion scan and chest x-ray adequate for interpretation during COVID 19 epidemic. RADIOPHARMACEUTICALS:  1.7 mCi Tc-22m MAA IV COMPARISON:  December 27, 2019. FINDINGS: No significant perfusion defect is noted. IMPRESSION: No definite perfusion defect is noted. No definite evidence of pulmonary embolus. Electronically Signed   By: Marijo Conception M.D.   On: 12/27/2019 13:23   CARDIAC CATHETERIZATION  Addendum Date: 12/28/2019    Mid RCA to Dist RCA lesion is 25% stenosed.  Mid LAD lesion is  25% stenosed.  2nd Diag lesion is 25% stenosed.  LV end diastolic pressure is normal.  There is no aortic valve stenosis.  Nonobstructive CAD.  Continue aggressive preventive therapy. Possible myocarditis as cause of elevated troponin in the setting of COVID.    Result Date: 12/28/2019  Mid RCA to Dist RCA lesion is 25% stenosed.  Mid LAD lesion is 25% stenosed.  2nd Diag lesion is 25% stenosed.  LV end diastolic pressure is normal.  There is no aortic valve stenosis.  No significant CAD.  Possible myocarditis as cause in the setting of COVID.    ECHOCARDIOGRAM LIMITED  Result Date: 12/27/2019   ECHOCARDIOGRAM LIMITED REPORT   Patient Name:   [facility] Date of Exam: 12/27/2019 Medical Rec #:  [id number]          Height:       67.0 in Accession #:    [id number]         Weight:       297.0 lb Date of Birth:  Sep 29, 1961          BSA:          2.39 m Patient Age:    78 years           BP:           136/91 mmHg Patient Gender: M                  HR:           105 bpm. Exam Location:  [facility]  Procedure: 2D Echo, Cardiac Doppler and Color Doppler Indications:    Chest Pain  History:        Patient has no prior history of Echocardiogram examinations.                 ESRD, Covid,Acute respiratory distress,Morbid obesity.  Sonographer:    Alvino Chapel RCS Referring Phys: [id number] [place] D Keeler  1. Left ventricular ejection fraction, by visual estimation, is 55 to 60%. The left ventricle has normal function. Unable to assess regional wall motion.  2. Global right ventricle has normal systolc function.The right ventricular size is mildly enlarged. Right ventricular wall thickness was not assessed.  3. Mild mitral annular calcification.  4. The mitral valve is grossly normal. Trivial mitral valve regurgitation.  5. No evidence of aortic valve sclerosis or stenosis.  6. The inferior vena cava is dilated in size with <50% respiratory variability, suggesting right atrial  pressure of 15 mmHg.  7.  Left ventricular diastolic parameters are consistent with Grade I diastolic dysfunction (impaired relaxation). FINDINGS  Left Ventricle: Left ventricular ejection fraction, by visual estimation, is 55 to 60%. The left ventricle has normal function. The left ventricular internal cavity size was the left ventricle is normal in size. There is moderately increased left ventricular wall thickness. Concentric left ventricular hypertrophy. Left ventricular diastolic parameters are consistent with Grade I diastolic dysfunction (impaired relaxation). Right Ventricle: The right ventricular size is mildly enlarged. Right vetricular wall thickness was not assessed. Global RV systolic function is has normal systolic function. Left Atrium: Left atrial size was normal in size. Right Atrium: Right atrial size was not well visualized. Right atrial pressure is estimated at 15 mmHg. Pericardium: There is no evidence of pericardial effusion is seen. There is no evidence of pericardial effusion. Mitral Valve: The mitral valve is grossly normal. Mild mitral annular calcification. MV Area by PHT, 3.02 cm. MV PHT, 72.93 msec. Trivial mitral valve regurgitation. Tricuspid Valve: The tricuspid valve is not well visualized. Aortic Valve: The aortic valve is structurally normal, with no evidence of sclerosis or stenosis. Aorta: The aortic root is normal in size and structure. Venous: The inferior vena cava is dilated in size with less than 50% respiratory variability, suggesting right atrial pressure of 15 mmHg.  LEFT VENTRICLE          Normals PLAX 2D LVIDd:         4.85 cm  3.6 cm   Diastology                  Normals LVIDs:         3.36 cm  1.7 cm   LV e' lateral:   10.30 cm/s 6.42 cm/s LV PW:         1.43 cm  1.4 cm   LV E/e' lateral: 5.4        15.4 LV IVS:        1.42 cm  1.3 cm   LV e' medial:    5.87 cm/s  6.96 cm/s LVOT diam:     1.90 cm  2.0 cm   LV E/e' medial:  9.5        6.96 LV SV:         64 ml    79 ml LV SV Index:   24.66     45 ml/m2 LVOT Area:     2.84 cm 3.14 cm2  RIGHT VENTRICLE RV S prime:     11.10 cm/s TAPSE (M-mode): 1.1 cm LEFT ATRIUM         Index LA diam:    2.90 cm 1.21 cm/m   AORTA                 Normals Ao Root diam: 3.05 cm 31 mm MITRAL VALVE              Normals MV Area (PHT): 3.02 cm             SHUNTS MV PHT:        72.93 msec 55 ms     Systemic Diam: 1.90 cm MV Decel Time: 252 msec   187 ms MV E velocity: 55.55 cm/s 103 cm/s MV A velocity: 66.70 cm/s 70.3 cm/s MV E/A ratio:  0.83       1.5  Kate Sable MD Electronically signed by Kate Sable MD Signature Date/Time: 12/27/2019/3:23:43 PMThe mitral valve is grossly normal.  Final      Medications:   . sodium chloride     . allopurinol  100 mg Oral Daily  . aspirin  81 mg Oral Daily  . atorvastatin  80 mg Oral Daily  . carvedilol  25 mg Oral BID WC  . Chlorhexidine Gluconate Cloth  6 each Topical Q0600  . insulin aspart  0-5 Units Subcutaneous QHS  . insulin aspart  0-6 Units Subcutaneous TID WC  . insulin aspart  2 Units Subcutaneous TID WC  . loratadine  5 mg Oral Daily  . sevelamer carbonate  800 mg Oral TID WC  . sodium chloride flush  3 mL Intravenous Q12H  . sodium chloride flush  3 mL Intravenous Q12H  . sodium zirconium cyclosilicate  10 g Oral TID   sodium chloride, acetaminophen, alteplase, lidocaine (PF), lidocaine-prilocaine, ondansetron (ZOFRAN) IV, ondansetron **OR** [DISCONTINUED] ondansetron (ZOFRAN) IV, pentafluoroprop-tetrafluoroeth, sodium chloride flush  Assessment/ Plan:   ESRD-Monday Wednesday Friday dialysis.  [facility].  HD today via AVF on schedule  Hyperkalemia will dialyze today 2 K bath is ordered.  Will stop lokelma as the HD should take care of the K  ANEMIA-does not appear to be an issue at this time- no ESA needs  MBD-continue renvela and vitamin D, 3.5 mcg of Hectorol Monday Wednesday Friday  HTN/VOL-we will aim for fluid removal.  To challenge given this SOB-  Likely has lost weight  with COVID  ACCESS-left forearm fistula  Diabetes as per primary team  Shortness of breath 2D echo shows diastolic dysfunction.  Troponin is greater than 6000 indicating NSTEMI.  Cardiac catheterization done-  [facility]  Dispo-  He is not sure-       LOS: 2 Mekesha Solomon A Truth Wolaver @TODAY @9 :49 AM

## 2019-12-29 NOTE — Progress Notes (Addendum)
The patient has been seen in conjunction with Jesus Bellis, NP. All aspects of care have been considered and discussed. The patient has been personally interviewed, examined, and all clinical data has been reviewed.   Persistent elevation in troponin likely represents myocardial injury in combination with kidney disease.  Explanation for elevated troponin/injury is potentially related to Covid-19 which can lead to residual myocardial inflammation and is poorly understood.  In the presence of preserved LV systolic function and in absence of data, no further specific measures are recommended.  Recent data shows that MRI evidence of myocardial inflammation myocarditis can be found greater than 4 weeks after COVID-19 infection and asymptomatic patients.  Continue beta-blocker therapy.  RAAS inhibition is not a good idea given dialysis dependent ESRD.   [facility] will sign off.   Medication Recommendations:  Repeat echo 2-3 months to exclude systolic dysfunction Other recommendations (labs, testing, etc):  None Follow up as an outpatient:  Dr. Bronson Williams for f/u presumed Covid 19 myocarditis     Progress Note  Patient Name: Jesus Williams Date of Encounter: 12/29/2019  Primary Cardiologist: Jesus Sable, MD   Subjective   Feeling well this morning. No chest pain.   Inpatient Medications    Scheduled Meds: . allopurinol  100 mg Oral Daily  . aspirin  81 mg Oral Daily  . atorvastatin  80 mg Oral Daily  . carvedilol  25 mg Oral BID WC  . Chlorhexidine Gluconate Cloth  6 each Topical Q0600  . insulin aspart  0-5 Units Subcutaneous QHS  . insulin aspart  0-6 Units Subcutaneous TID WC  . insulin aspart  2 Units Subcutaneous TID WC  . loratadine  5 mg Oral Daily  . sevelamer carbonate  800 mg Oral TID WC  . sodium chloride flush  3 mL Intravenous Q12H  . sodium chloride flush  3 mL Intravenous Q12H  . sodium zirconium cyclosilicate  10 g Oral TID   Continuous  Infusions: . sodium chloride     PRN Meds: sodium chloride, acetaminophen, alteplase, lidocaine (PF), lidocaine-prilocaine, ondansetron (ZOFRAN) IV, ondansetron **OR** [DISCONTINUED] ondansetron (ZOFRAN) IV, pentafluoroprop-tetrafluoroeth, sodium chloride flush   Vital Signs    Vitals:   12/28/19 1830 12/28/19 2052 12/29/19 0453 12/29/19 0512  BP: 106/70  (!) 159/102 108/74  Pulse:   97 94  Resp:      Temp:  99.6 F (37.6 C) 98.6 F (37 C)   TempSrc:      SpO2: 91%  99%   Weight:   132.6 kg   Height:        Intake/Output Summary (Last 24 hours) at 12/29/2019 0847 Last data filed at 12/28/2019 1915 Gross per 24 hour  Intake 360 ml  Output -  Net 360 ml   Last 3 Weights 12/29/2019 12/28/2019 12/27/2019  Weight (lbs) 292 lb 6.4 oz 296 lb 295 lb 13.7 oz  Weight (kg) 132.632 kg 134.265 kg 134.2 kg      Telemetry    ST - Personally Reviewed  ECG    No new tracing this morning.  Physical Exam  [name], sitting up in bed eating breakfast GEN: No acute distress.   Neck: No JVD Cardiac: Tachy, no murmurs, rubs, or gallops.  Respiratory: Clear to auscultation bilaterally. GI: Soft, nontender, non-distended  MS: No edema; No deformity. Right femoral cath site stable.  Neuro:  Nonfocal  Psych: Normal affect   Labs    High Sensitivity Troponin:   Recent Labs  Lab 12/27/19  1513 12/27/19 1933 12/27/19 2229 12/28/19 0136 12/28/19 0505  TROPONINIHS 2,648* 3,483* 4,663* 5,381* 6,317*      Chemistry Recent Labs  Lab 12/27/19 0749 12/27/19 1152 12/28/19 0505  NA 134* 138 134*  K 6.5* 5.4* 5.4*  CL 96* 100 93*  CO2 21* 23 24  GLUCOSE 327* 96 147*  BUN 58* 59* 43*  CREATININE 11.54* 12.14* 9.71*  CALCIUM 8.6* 8.9 8.9  PROT 7.3  --  7.7  ALBUMIN 3.2* 3.2* 3.4*  AST 21  --  33  ALT 28  --  33  ALKPHOS 118  --  94  BILITOT 0.9  --  0.8  GFRNONAA 4* 4* 5*  GFRAA 5* 5* 6*  ANIONGAP 17* 15 17*     Hematology Recent Labs  Lab 12/27/19 1152 12/28/19  0505 12/29/19 0235  WBC 8.5 6.6 5.7  RBC 3.49* 3.52* 3.24*  HGB 11.2* 11.2* 10.3*  HCT 35.6* 35.8* 32.0*  MCV 102.0* 101.7* 98.8  MCH 32.1 31.8 31.8  MCHC 31.5 31.3 32.2  RDW 16.7* 16.7* 16.7*  PLT 188 181 164    BNPNo results for input(s): BNP, PROBNP in the last 168 hours.   DDimer  Recent Labs  Lab 12/27/19 0749  DDIMER 0.62*     Radiology    NM Pulmonary Perfusion  Result Date: 12/27/2019 CLINICAL DATA:  Shortness of breath. EXAM: NUCLEAR MEDICINE PERFUSION LUNG SCAN TECHNIQUE: Perfusion images were obtained in multiple projections after intravenous injection of radiopharmaceutical. Ventilation scans intentionally deferred if perfusion scan and chest x-ray adequate for interpretation during COVID 19 epidemic. RADIOPHARMACEUTICALS:  1.7 mCi Tc-27m MAA IV COMPARISON:  December 27, 2019. FINDINGS: No significant perfusion defect is noted. IMPRESSION: No definite perfusion defect is noted. No definite evidence of pulmonary embolus. Electronically Signed   By: Marijo Conception M.D.   On: 12/27/2019 13:23   CARDIAC CATHETERIZATION  Addendum Date: 12/28/2019    Mid RCA to Dist RCA lesion is 25% stenosed.  Mid LAD lesion is 25% stenosed.  2nd Diag lesion is 25% stenosed.  LV end diastolic pressure is normal.  There is no aortic valve stenosis.  Nonobstructive CAD.  Continue aggressive preventive therapy. Possible myocarditis as cause of elevated troponin in the setting of COVID.    Result Date: 12/28/2019  Mid RCA to Dist RCA lesion is 25% stenosed.  Mid LAD lesion is 25% stenosed.  2nd Diag lesion is 25% stenosed.  LV end diastolic pressure is normal.  There is no aortic valve stenosis.  No significant CAD.  Possible myocarditis as cause in the setting of COVID.    ECHOCARDIOGRAM LIMITED  Result Date: 12/27/2019   ECHOCARDIOGRAM LIMITED REPORT   Patient Name:   Jesus Williams [facility] Date of Exam: 12/27/2019 Medical Rec #:  [id number]          Height:       67.0 in Accession #:     [id number]         Weight:       297.0 lb Date of Birth:  Aug 16, 1961          BSA:          2.39 m Patient Age:    59 years           BP:           136/91 mmHg Patient Gender: M                  HR:  105 bpm. Exam Location:  [facility]  Procedure: 2D Echo, Cardiac Doppler and Color Doppler Indications:    Chest Pain  History:        Patient has no prior history of Echocardiogram examinations.                 ESRD, Covid,Acute respiratory distress,Morbid obesity.  Sonographer:    Alvino Chapel RCS Referring Phys: [id number] [place]  1. Left ventricular ejection fraction, by visual estimation, is 55 to 60%. The left ventricle has normal function. Unable to assess regional wall motion.  2. Global right ventricle has normal systolc function.The right ventricular size is mildly enlarged. Right ventricular wall thickness was not assessed.  3. Mild mitral annular calcification.  4. The mitral valve is grossly normal. Trivial mitral valve regurgitation.  5. No evidence of aortic valve sclerosis or stenosis.  6. The inferior vena cava is dilated in size with <50% respiratory variability, suggesting right atrial pressure of 15 mmHg.  7. Left ventricular diastolic parameters are consistent with Grade I diastolic dysfunction (impaired relaxation). FINDINGS  Left Ventricle: Left ventricular ejection fraction, by visual estimation, is 55 to 60%. The left ventricle has normal function. The left ventricular internal cavity size was the left ventricle is normal in size. There is moderately increased left ventricular wall thickness. Concentric left ventricular hypertrophy. Left ventricular diastolic parameters are consistent with Grade I diastolic dysfunction (impaired relaxation). Right Ventricle: The right ventricular size is mildly enlarged. Right vetricular wall thickness was not assessed. Global RV systolic function is has normal systolic function. Left Atrium: Left atrial size was normal in size. Right  Atrium: Right atrial size was not well visualized. Right atrial pressure is estimated at 15 mmHg. Pericardium: There is no evidence of pericardial effusion is seen. There is no evidence of pericardial effusion. Mitral Valve: The mitral valve is grossly normal. Mild mitral annular calcification. MV Area by PHT, 3.02 cm. MV PHT, 72.93 msec. Trivial mitral valve regurgitation. Tricuspid Valve: The tricuspid valve is not well visualized. Aortic Valve: The aortic valve is structurally normal, with no evidence of sclerosis or stenosis. Aorta: The aortic root is normal in size and structure. Venous: The inferior vena cava is dilated in size with less than 50% respiratory variability, suggesting right atrial pressure of 15 mmHg.  LEFT VENTRICLE          Normals PLAX 2D LVIDd:         4.85 cm  3.6 cm   Diastology                  Normals LVIDs:         3.36 cm  1.7 cm   LV e' lateral:   10.30 cm/s 6.42 cm/s LV PW:         1.43 cm  1.4 cm   LV E/e' lateral: 5.4        15.4 LV IVS:        1.42 cm  1.3 cm   LV e' medial:    5.87 cm/s  6.96 cm/s LVOT diam:     1.90 cm  2.0 cm   LV E/e' medial:  9.5        6.96 LV SV:         64 ml    79 ml LV SV Index:   24.66    45 ml/m2 LVOT Area:     2.84 cm 3.14 cm2  RIGHT VENTRICLE RV S prime:  11.10 cm/s TAPSE (M-mode): 1.1 cm LEFT ATRIUM         Index LA diam:    2.90 cm 1.21 cm/m   AORTA                 Normals Ao Root diam: 3.05 cm 31 mm MITRAL VALVE              Normals MV Area (PHT): 3.02 cm             SHUNTS MV PHT:        72.93 msec 55 ms     Systemic Diam: 1.90 cm MV Decel Time: 252 msec   187 ms MV E velocity: 55.55 cm/s 103 cm/s MV A velocity: 66.70 cm/s 70.3 cm/s MV E/A ratio:  0.83       1.5  Jesus Sable MD Electronically signed by Jesus Sable MD Signature Date/Time: 12/27/2019/3:23:43 PMThe mitral valve is grossly normal.    Final     Cardiac Studies   Cath: 12/28/19   Mid RCA to Dist RCA lesion is 25% stenosed.  Mid LAD lesion is 25% stenosed.   2nd Diag lesion is 25% stenosed.  LV end diastolic pressure is normal.  There is no aortic valve stenosis.   Nonobstructive CAD.  Continue aggressive preventive therapy.   Possible myocarditis as cause of elevated troponin in the setting of COVID.    Diagnostic Dominance: Right    Patient Profile     59 y.o. male with a hx of recent Covid-19 PNA, morbid obesity, OSA (intolerant of CPAP), ESRD on HD x 3 years, hyponatremia, gouty arthritis, HLD, chronic respiratory failure on home O2, DM (A1C 7.0 12/07/19), RBBB, mild anemia (during recent admit)who is being seen today for the evaluation of shortness of breath, abnormal EKG/troponinat the request of Dr. Regenia Skeeter  Assessment & Plan    1. NSTEMI: hsTn peaked at 6317. Treated with IV heparin for about 24 hours. Underwent cardiac cath noted above with non obstructive disease noted. Suspect myocarditis in the setting of recent COVID infection. Echo with normal EF. He is treated with ASA, statin, and BB. Will review with MD regarding whether there is a role for steroids? He was treated with dexamethasone during his hospitalization back 12/29-1/2.  2. ESRD on HD: nephrology following. Had HD 12/27/19.   3. Hyperkalemia: K+ 5.4 yesterday. On Lokelma 10g TID. Morning labs pending. HD today.  4. DM: per primary.   5. OSA: reports intolerance to Cpap  For questions or updates, please contact [place] Please consult [email or web address] for contact info under   Signed, Jesus Bellis, NP  12/29/2019, 8:47 AM

## 2019-12-30 LAB — HEPATITIS B E ANTIGEN: Hep B E Ag: NEGATIVE

## 2019-12-30 LAB — HEPATITIS B SURFACE ANTIBODY, QUANTITATIVE: Hep B S AB Quant (Post): 6.8 m[IU]/mL — ABNORMAL LOW (ref >9.9)

## 2020-01-03 ENCOUNTER — Encounter (HOSPITAL_COMMUNITY): Payer: Self-pay | Admitting: Emergency Medicine

## 2020-01-03 ENCOUNTER — Emergency Department (HOSPITAL_COMMUNITY): Payer: No Typology Code available for payment source

## 2020-01-03 ENCOUNTER — Emergency Department (HOSPITAL_COMMUNITY)
Admission: EM | Admit: 2020-01-03 | Discharge: 2020-01-03 | Disposition: A | Discharge status: Home / Self Care | Payer: No Typology Code available for payment source | Attending: Emergency Medicine | Admitting: Emergency Medicine

## 2020-01-03 ENCOUNTER — Other Ambulatory Visit: Payer: Self-pay

## 2020-01-03 DIAGNOSIS — Z992 Dependence on renal dialysis: Secondary | ICD-10-CM | POA: Insufficient documentation

## 2020-01-03 DIAGNOSIS — Z20822 Contact with and (suspected) exposure to covid-19: Secondary | ICD-10-CM | POA: Insufficient documentation

## 2020-01-03 DIAGNOSIS — R0902 Hypoxemia: Secondary | ICD-10-CM | POA: Insufficient documentation

## 2020-01-03 DIAGNOSIS — J449 Chronic obstructive pulmonary disease, unspecified: Secondary | ICD-10-CM | POA: Insufficient documentation

## 2020-01-03 DIAGNOSIS — R0602 Shortness of breath: Secondary | ICD-10-CM

## 2020-01-03 DIAGNOSIS — E1122 Type 2 diabetes mellitus with diabetic chronic kidney disease: Secondary | ICD-10-CM | POA: Diagnosis not present

## 2020-01-03 DIAGNOSIS — Z8616 Personal history of COVID-19: Secondary | ICD-10-CM | POA: Diagnosis not present

## 2020-01-03 DIAGNOSIS — N186 End stage renal disease: Secondary | ICD-10-CM | POA: Insufficient documentation

## 2020-01-03 DIAGNOSIS — I120 Hypertensive chronic kidney disease with stage 5 chronic kidney disease or end stage renal disease: Secondary | ICD-10-CM | POA: Diagnosis not present

## 2020-01-03 LAB — CBC WITH DIFFERENTIAL/PLATELET
Abs Immature Granulocytes: 0.04 10*3/uL (ref 0.00–0.07)
Basophils Absolute: 0 10*3/uL (ref 0.0–0.1)
Basophils Relative: 0 %
Eosinophils Absolute: 0.4 10*3/uL (ref 0.0–0.5)
Eosinophils Relative: 6 %
HCT: 31.7 % — ABNORMAL LOW (ref 39.0–52.0)
Hemoglobin: 9.7 g/dL — ABNORMAL LOW (ref 13.0–17.0)
Immature Granulocytes: 1 %
Lymphocytes Relative: 8 %
Lymphs Abs: 0.5 10*3/uL — ABNORMAL LOW (ref 0.7–4.0)
MCH: 31.7 pg (ref 26.0–34.0)
MCHC: 30.6 g/dL (ref 30.0–36.0)
MCV: 103.6 fL — ABNORMAL HIGH (ref 80.0–100.0)
Monocytes Absolute: 0.6 10*3/uL (ref 0.1–1.0)
Monocytes Relative: 10 %
Neutro Abs: 4.7 10*3/uL (ref 1.7–7.7)
Neutrophils Relative %: 75 %
Platelets: 304 10*3/uL (ref 150–400)
RBC: 3.06 MIL/uL — ABNORMAL LOW (ref 4.22–5.81)
RDW: 17.2 % — ABNORMAL HIGH (ref 11.5–15.5)
WBC: 6.2 10*3/uL (ref 4.0–10.5)
nRBC: 0 % (ref 0.0–0.2)

## 2020-01-03 LAB — COMPREHENSIVE METABOLIC PANEL
ALT: 46 U/L — ABNORMAL HIGH (ref 0–44)
AST: 19 U/L (ref 15–41)
Albumin: 3.1 g/dL — ABNORMAL LOW (ref 3.5–5.0)
Alkaline Phosphatase: 90 U/L (ref 38–126)
Anion gap: 13 (ref 5–15)
BUN: 62 mg/dL — ABNORMAL HIGH (ref 6–20)
CO2: 30 mmol/L (ref 22–32)
Calcium: 8.7 mg/dL — ABNORMAL LOW (ref 8.9–10.3)
Chloride: 96 mmol/L — ABNORMAL LOW (ref 98–111)
Creatinine, Ser: 13.34 mg/dL — ABNORMAL HIGH (ref 0.61–1.24)
GFR calc Af Amer: 4 mL/min — ABNORMAL LOW (ref >60)
GFR calc non Af Amer: 4 mL/min — ABNORMAL LOW (ref >60)
Glucose, Bld: 185 mg/dL — ABNORMAL HIGH (ref 70–99)
Potassium: 5.2 mmol/L — ABNORMAL HIGH (ref 3.5–5.1)
Sodium: 139 mmol/L (ref 135–145)
Total Bilirubin: 0.4 mg/dL (ref 0.3–1.2)
Total Protein: 7.2 g/dL (ref 6.5–8.1)

## 2020-01-03 LAB — RESPIRATORY PANEL BY RT PCR (FLU A&B, COVID)
Influenza A by PCR: NEGATIVE
Influenza B by PCR: NEGATIVE
SARS Coronavirus 2 by RT PCR: NEGATIVE

## 2020-01-03 LAB — BRAIN NATRIURETIC PEPTIDE: B Natriuretic Peptide: 500 pg/mL — ABNORMAL HIGH (ref 0.0–100.0)

## 2020-01-03 NOTE — Progress Notes (Signed)
    NEPHROLOGY NURSING NOTE:  [facility] is able to accommodate patient for dialysis today Burnis Medin work him in") after discharge from ED.  Rockwell Alexandria, RN

## 2020-01-03 NOTE — Discharge Instructions (Signed)
You were seen in the emergency department today with shortness of breath and low oxygen levels.  Your lab work and chest x-ray are reassuring.  Your COVID-19 test is now negative.  We will attempt to arrange for you to have dialysis this afternoon.  Return to the emergency department any new or suddenly worsening symptoms.

## 2020-01-03 NOTE — ED Provider Notes (Signed)
Emergency Department Provider Note   I have reviewed the triage vital signs and the nursing notes.   HISTORY  Chief Complaint Shortness of Breath   HPI Jesus Williams is a 59 y.o. male with PMH of COPD on 3L [place] home O2 and ESRD (MWF) presents to the emergency department for evaluation of increased oxygen requirement from dialysis.  By patient's report he walked in dialysis and was a little bit short of breath.  He had his oxygen turned up from 3 L to 4 L.  When the nurses came to get vitals his oxygen saturation was reportedly in the 70s.  He is not feeling particularly short of breath although is having mild symptoms.  He denies chest pain.  EMS was called and he was transported now on 6 L nasal cannula oxygen.  He denies any fevers.  He did test positive for COVID-19 on December 28th.  He is not having any leg swelling, abdominal pain, diarrhea, vomiting.  The nurse called the dialysis center who state that actually the patient seemed to be in more distress than what is described.  He was having some diaphoresis and increased work of breathing in addition to his hypoxemia in the 70s which is why he was transported to the ED.   Past Medical History:  Diagnosis Date  . Chronic respiratory failure ([place])    a. diagnosed with COPD, started on O2 in 2016.  . Diabetes mellitus ([place])    a. a1c 7.0 on 11/2019.  Marland Kitchen ESRD on hemodialysis ([place])   . Gout   . Hyperlipidemia    mixed. Fair control with current meds.  . Hypertension   . Mild anemia   . Morbid obesity ([place])    Pt followed by a [facility].  Marland Kitchen OSA (obstructive sleep apnea)   . Pneumonia due to COVID-19 virus    Admitted 11/2019-12/2019    Patient Active Problem List   Diagnosis Date Noted  . NSTEMI (non-ST elevated myocardial infarction) ([place]) 12/28/2019  . Morbid obesity ([place])   . OSA (obstructive sleep apnea)   . Elevated troponin   . Dyspnea 12/27/2019  . ESRD (end stage renal disease) ([place]) 12/11/2019  .  Pneumonia due to COVID-19 virus   . SIRS (systemic inflammatory response syndrome) ([place]) 12/07/2019  . Type 2 diabetes mellitus ([place]) 12/07/2019  . COVID-19 12/07/2019  . Acute respiratory failure with hypoxia ([place]) 12/07/2019  . Hyperlipidemia   . Gout   . Hypertension   . Hyponatremia   . Anemia   . Leukopenia   . Acute respiratory distress     Past Surgical History:  Procedure Laterality Date  . AV FISTULA INSERTION W/ RF MAGNETIC GUIDANCE    . LEFT HEART CATH AND CORONARY ANGIOGRAPHY N/A 12/28/2019   Procedure: LEFT HEART CATH AND CORONARY ANGIOGRAPHY;  Surgeon: Jettie Booze, MD;  Location: [facility];  Service: Cardiovascular;  Laterality: N/A;    Allergies No known allergies  Family History  Problem Relation Age of Onset  . Hypertension Mother     Social History Social History   Tobacco Use  . Smoking status: Never Smoker  . Smokeless tobacco: Never Used  Substance Use Topics  . Alcohol use: Not Currently    Comment: very little, once every 6 months  . Drug use: Never    Review of Systems  Constitutional: No fever/chills Eyes: No visual changes. ENT: No sore throat. Cardiovascular: Denies chest pain. Respiratory: Positive shortness of breath and  increased O2 requirement.  Gastrointestinal: No abdominal pain.  No nausea, no vomiting.  No diarrhea.  No constipation. Genitourinary: Negative for dysuria. Musculoskeletal: Negative for back pain. Skin: Negative for rash. Neurological: Negative for headaches, focal weakness or numbness.  10-point ROS otherwise negative.  ____________________________________________   PHYSICAL EXAM:  VITAL SIGNS: ED Triage Vitals  Enc Vitals Group     BP 01/03/20 0711 105/68     Pulse Rate 01/03/20 0711 (!) 101     Resp 01/03/20 0711 18     Temp 01/03/20 0711 98.4 F (36.9 C)     Temp Source 01/03/20 0711 Oral     SpO2 01/03/20 0711 100 %     Weight 01/03/20 0709 287 lb (130.2 kg)     Height  01/03/20 0709 5\' 6"  (1.676 m)   Constitutional: Alert and oriented. Well appearing and in no acute distress. Eyes: Conjunctivae are normal.  Head: Atraumatic. Nose: No congestion/rhinnorhea. Mouth/Throat: Mucous membranes are moist. Neck: No stridor.   Cardiovascular: Normal rate, regular rhythm. Good peripheral circulation. Grossly normal heart sounds.   Respiratory: Normal respiratory effort.  No retractions. Lungs CTAB. No wheezing, rales, or rhonchi.  Gastrointestinal: Soft and nontender. No distention.  Musculoskeletal: No lower extremity tenderness nor edema.  Neurologic:  Normal speech and language.  Skin:  Skin is warm, dry and intact. No rash noted.  ____________________________________________   LABS (all labs ordered are listed, but only abnormal results are displayed)  Labs Reviewed  COMPREHENSIVE METABOLIC PANEL - Abnormal; Notable for the following components:      Result Value   Potassium 5.2 (*)    Chloride 96 (*)    Glucose, Bld 185 (*)    BUN 62 (*)    Creatinine, Ser 13.34 (*)    Calcium 8.7 (*)    Albumin 3.1 (*)    ALT 46 (*)    GFR calc non Af Amer 4 (*)    GFR calc Af Amer 4 (*)    All other components within normal limits  CBC WITH DIFFERENTIAL/PLATELET - Abnormal; Notable for the following components:   RBC 3.06 (*)    Hemoglobin 9.7 (*)    HCT 31.7 (*)    MCV 103.6 (*)    RDW 17.2 (*)    Lymphs Abs 0.5 (*)    All other components within normal limits  BRAIN NATRIURETIC PEPTIDE - Abnormal; Notable for the following components:   B Natriuretic Peptide 500.0 (*)    All other components within normal limits  RESPIRATORY PANEL BY RT PCR (FLU A&B, COVID)   ____________________________________________  EKG   EKG Interpretation  Date/Time:  Monday January 03 2020 07:14:11 EST Ventricular Rate:  99 PR Interval:    QRS Duration: 143 QT Interval:  391 QTC Calculation: 502 R Axis:   160 Text Interpretation: Sinus rhythm Probable left atrial  enlargement Right bundle branch block Similar to prior. No STEMI Confirmed by Nanda Quinton [phone]) on 01/03/2020 8:04:10 AM       ____________________________________________  RADIOLOGY  DG Chest Portable 1 View  Result Date: 01/03/2020 CLINICAL DATA:  Shortness of breath, COVID pneumonia EXAM: PORTABLE CHEST 1 VIEW COMPARISON:  12/27/2019 FINDINGS: Stable cardiomegaly with minor vascular prominence and basilar atelectasis. No new focal pneumonia, collapse or consolidation. Negative for edema, effusion or pneumothorax. Trachea midline. IMPRESSION: Stable cardiomegaly with minor residual basilar atelectasis. Electronically Signed   By: Jerilynn Mages.  Shick M.D.   On: 01/03/2020 07:49    ____________________________________________   PROCEDURES  Procedure(s)  performed:   Procedures  None  ____________________________________________   INITIAL IMPRESSION / ASSESSMENT AND PLAN / ED COURSE  Pertinent labs & imaging results that were available during my care of the patient were reviewed by me and considered in my medical decision making (see chart for details).   Patient presents to the emergency department from dialysis with increased oxygen requirement and shortness of breath.  Patient is very well-appearing at this time with no tachypnea or significant increased work of breathing.  No diaphoresis.  Doubt atypical ACS or PE.  Patient last dialyzed on Friday.  Plan for screening labs to assess electrolytes and need for emergent dialysis.  Chest x-ray pending.  I have turned the patient down to his baseline oxygen at 3 L and will monitor here in the ED. Notified nursing of O2 change.   09:10 AM  Covid PCR now negative.  Potassium 5.2.  BUN 62.  Patient continues to be very well-appearing with no increased work of breathing.  He is on his home oxygen level here in the emergency department with no hypoxemia.  Chest x-ray without significant pulmonary edema.  Will attempt to arrange for afternoon  dialysis and discharge.  ____________________________________________  FINAL CLINICAL IMPRESSION(S) / ED DIAGNOSES  Final diagnoses:  Hypoxia  SOB (shortness of breath)    Note:  This document was prepared using Dragon voice recognition software and may include unintentional dictation errors.  Nanda Quinton, MD, [facility] Emergency [facility]    Tiphany Fayson, [facility], MD 01/03/20 [phone]

## 2020-01-03 NOTE — ED Triage Notes (Signed)
Pt brought in from dialysis for shortness of breath. States this morning he felt bad but feels better right now. Was just d/c from [facility] where he had an "exploratory cath" which showed "inflammation behind his heart".

## 2020-01-19 NOTE — Progress Notes (Deleted)
Cardiology Office Note    Date:  01/19/2020   ID:  Atwood, Wardrop 1961/09/11, MRN [id number]  PCP:  System, Provider Not In  Cardiologist: Kate Sable, MD EPS: None  No chief complaint on file.   History of Present Illness:  Jesus Williams is a 59 y.o. male with a hx of recent Covid-19 PNA, morbid obesity, OSA (intolerant of CPAP), ESRD on HD x 3 years, hyponatremia, gouty arthritis, HLD, chronic respiratory failure on home O2, DM (A1C 7.0 12/07/19), RBBB, mild anemia   Patient was readmitted with worsening shortness of breath elevated troponins peaked at 6317.  Cardiac catheterization showed nonobstructive disease.  Suspect myocarditis in the setting of recent Covid infection.  Echo with normal LVEF.  R AAS inhibition is not a good idea given end-stage renal disease on dialysis.    Past Medical History:  Diagnosis Date  . Chronic respiratory failure ([place])    a. diagnosed with COPD, started on O2 in 2016.  . Diabetes mellitus ([place])    a. a1c 7.0 on 11/2019.  Marland Kitchen ESRD on hemodialysis ([place])   . Gout   . Hyperlipidemia    mixed. Fair control with current meds.  . Hypertension   . Mild anemia   . Morbid obesity ([place])    Pt followed by a Nutrition Clinic.  Marland Kitchen OSA (obstructive sleep apnea)   . Pneumonia due to COVID-19 virus    Admitted 11/2019-12/2019    Past Surgical History:  Procedure Laterality Date  . AV FISTULA INSERTION W/ RF MAGNETIC GUIDANCE    . LEFT HEART CATH AND CORONARY ANGIOGRAPHY N/A 12/28/2019   Procedure: LEFT HEART CATH AND CORONARY ANGIOGRAPHY;  Surgeon: Jettie Booze, MD;  Location: [facility];  Service: Cardiovascular;  Laterality: N/A;    Current Medications: No outpatient medications have been marked as taking for the 01/24/20 encounter (Appointment) with Imogene Burn, PA-C.     Allergies:   No known allergies   Social History   Socioeconomic History  . Marital status: Single    Spouse name: Not on file  .  Number of children: Not on file  . Years of education: Not on file  . Highest education level: Not on file  Occupational History  . Not on file  Tobacco Use  . Smoking status: Never Smoker  . Smokeless tobacco: Never Used  Substance and Sexual Activity  . Alcohol use: Not Currently    Comment: very little, once every 6 months  . Drug use: Never  . Sexual activity: Not on file  Other Topics Concern  . Not on file  Social History Narrative  . Not on file   Social Determinants of Health   Financial Resource Strain:   . Difficulty of Paying Living Expenses: Not on file  Food Insecurity:   . Worried About Charity fundraiser in the Last Year: Not on file  . Ran Out of Food in the Last Year: Not on file  Transportation Needs:   . Lack of Transportation (Medical): Not on file  . Lack of Transportation (Non-Medical): Not on file  Physical Activity:   . Days of Exercise per Week: Not on file  . Minutes of Exercise per Session: Not on file  Stress:   . Feeling of Stress : Not on file  Social Connections:   . Frequency of Communication with Friends and Family: Not on file  . Frequency of Social Gatherings with Friends and Family: Not on file  .  Attends Religious Services: Not on file  . Active Member of Clubs or Organizations: Not on file  . Attends Archivist Meetings: Not on file  . Marital Status: Not on file     Family History:  The patient's ***family history includes Hypertension in his mother.   ROS:   Please see the history of present illness.    ROS All other systems reviewed and are negative.   PHYSICAL EXAM:   VS:  There were no vitals taken for this visit.  Physical Exam  GEN: Well nourished, well developed, in no acute distress  HEENT: normal  Neck: no JVD, carotid bruits, or masses Cardiac:RRR; no murmurs, rubs, or gallops  Respiratory:  clear to auscultation bilaterally, normal work of breathing GI: soft, nontender, nondistended, + BS Ext:  without cyanosis, clubbing, or edema, Good distal pulses bilaterally MS: no deformity or atrophy  Skin: warm and dry, no rash Neuro:  Alert and Oriented x 3, Strength and sensation are intact Psych: euthymic mood, full affect  Wt Readings from Last 3 Encounters:  01/03/20 287 lb (130.2 kg)  12/29/19 286 lb 9.6 oz (130 kg)  12/11/19 295 lb 13.7 oz (134.2 kg)      Studies/Labs Reviewed:   EKG:  EKG is*** ordered today.  The ekg ordered today demonstrates ***  Recent Labs: 12/29/2019: Magnesium 2.0 01/03/2020: ALT 46; B Natriuretic Peptide 500.0; BUN 62; Creatinine, Ser 13.34; Hemoglobin 9.7; Platelets 304; Potassium 5.2; Sodium 139   Lipid Panel    Component Value Date/Time   CHOL 84 12/28/2019 0505   TRIG 98 12/28/2019 0505   HDL 32 (L) 12/28/2019 0505   CHOLHDL 2.6 12/28/2019 0505   VLDL 20 12/28/2019 0505   LDLCALC 32 12/28/2019 0505    Additional studies/ records that were reviewed today include:  Cath: 12/28/19    Mid RCA to Dist RCA lesion is 25% stenosed.  Mid LAD lesion is 25% stenosed.  2nd Diag lesion is 25% stenosed.  LV end diastolic pressure is normal.  There is no aortic valve stenosis.   Nonobstructive CAD.  Continue aggressive preventive therapy.    Possible myocarditis as cause of elevated troponin in the setting of COVID.     2D echo 1/18/2021IMPRESSIONS     1. Left ventricular ejection fraction, by visual estimation, is 55 to  60%. The left ventricle has normal function. Unable to assess regional  wall motion.   2. Global right ventricle has normal systolc function.The right  ventricular size is mildly enlarged. Right ventricular wall thickness was  not assessed.   3. Mild mitral annular calcification.   4. The mitral valve is grossly normal. Trivial mitral valve  regurgitation.   5. No evidence of aortic valve sclerosis or stenosis.   6. The inferior vena cava is dilated in size with <50% respiratory  variability, suggesting right atrial  pressure of 15 mmHg.   7. Left ventricular diastolic parameters are consistent with Grade I  diastolic dysfunction (impaired relaxation).   FINDINGS   Left Ventricle: Left ventricular ejection fraction, by visual estimation,  is 55 to 60%. The left ventricle has normal function. The left ventricular  internal cavity size was the left ventricle is normal in size. There is  moderately increased left  ventricular wall thickness. Concentric left ventricular hypertrophy. Left  ventricular diastolic parameters are consistent with Grade I diastolic  dysfunction (impaired relaxation).   Right Ventricle: The right ventricular size is mildly enlarged. Right  vetricular wall thickness was not  assessed. Global RV systolic function is  has normal systolic function.   Left Atrium: Left atrial size was normal in size.   Right Atrium: Right atrial size was not well visualized. Right atrial  pressure is estimated at 15 mmHg.   Pericardium: There is no evidence of pericardial effusion is seen. There  is no evidence of pericardial effusion.   Mitral Valve: The mitral valve is grossly normal. Mild mitral annular  calcification. MV Area by PHT, 3.02 cm. MV PHT, 72.93 msec. Trivial  mitral valve regurgitation.   Tricuspid Valve: The tricuspid valve is not well visualized.   Aortic Valve: The aortic valve is structurally normal, with no evidence of  sclerosis or stenosis.   Aorta: The aortic root is normal in size and structure.   Venous: The inferior vena cava is dilated in size with less than 50%  respiratory variability, suggesting right atrial pressure of 15 mmHg.        ASSESSMENT:    1. Coronary artery disease involving native coronary artery of native heart without angina pectoris   2. 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 ESRD (end stage renal disease) ([place])   4. OSA (obstructive sleep apnea)      PLAN:  In order of problems listed above:  CAD NSTEMI with peak troponin of  6317 cardiac cath revealing nonobstructive disease, suspect myocarditis in the setting of recent Covid infection.  Echo with normal LVEF.  Treated with aspirin statin and beta-blocker.  ESRD on HD  OSA intolerant to CPAP  DM   Medication Adjustments/Labs and Tests Ordered: Current medicines are reviewed at length with the patient today.  Concerns regarding medicines are outlined above.  Medication changes, Labs and Tests ordered today are listed in the Patient Instructions below. There are no Patient Instructions on file for this visit.   Sumner Boast, PA-C  01/19/2020 12:19 PM    [facility] [address], [place], [place]  [address] Phone: [phone]; Fax: [phone]

## 2020-01-24 ENCOUNTER — Ambulatory Visit: Payer: Medicare Other | Admitting: Physician Assistant

## 2020-01-27 ENCOUNTER — Encounter: Payer: Self-pay | Admitting: Student

## 2020-01-27 ENCOUNTER — Telehealth (INDEPENDENT_AMBULATORY_CARE_PROVIDER_SITE_OTHER): Payer: Medicare Other | Admitting: Student

## 2020-01-27 VITALS — BP 120/85 | Ht 66.0 in | Wt 297.0 lb

## 2020-01-27 DIAGNOSIS — I400 Infective myocarditis: Secondary | ICD-10-CM

## 2020-01-27 DIAGNOSIS — I10 Essential (primary) hypertension: Secondary | ICD-10-CM

## 2020-01-27 DIAGNOSIS — I2510 Atherosclerotic heart disease of native coronary artery without angina pectoris: Secondary | ICD-10-CM

## 2020-01-27 DIAGNOSIS — U071 COVID-19: Secondary | ICD-10-CM

## 2020-01-27 DIAGNOSIS — N186 End stage renal disease: Secondary | ICD-10-CM

## 2020-01-27 DIAGNOSIS — E785 Hyperlipidemia, unspecified: Secondary | ICD-10-CM

## 2020-01-27 DIAGNOSIS — G4733 Obstructive sleep apnea (adult) (pediatric): Secondary | ICD-10-CM

## 2020-01-27 NOTE — Patient Instructions (Signed)
Medication Instructions:  Your physician recommends that you continue on your current medications as directed. Please refer to the Current Medication list given to you today.   Labwork: NONE   Testing/Procedures: Your physician has requested that you have an echocardiogram. Echocardiography is a painless test that uses sound waves to create images of your heart. It provides your doctor with information about the size and shape of your heart and how well your heart's chambers and valves are working. This procedure takes approximately one hour. There are no restrictions for this procedure.    Follow-Up: Your physician wants you to follow-up in: 5-6 Months with Dr. Virgina Jock will receive a reminder letter in the mail two months in advance. If you don't receive a letter, please call our office to schedule the follow-up appointment.   Any Other Special Instructions Will Be Listed Below (If Applicable).     If you need a refill on your cardiac medications before your next appointment, please call your pharmacy.  Thank you for choosing [facility]!

## 2020-01-27 NOTE — Progress Notes (Signed)
Virtual Visit via Telephone Note   This visit type was conducted due to national recommendations for restrictions regarding the COVID-19 Pandemic (e.g. social distancing) in an effort to limit this patient's exposure and mitigate transmission in our community.  Due to his co-morbid illnesses, this patient is at least at moderate risk for complications without adequate follow up.  This format is felt to be most appropriate for this patient at this time.  The patient did not have access to video technology/had technical difficulties with video requiring transitioning to audio format only (telephone).  All issues noted in this document were discussed and addressed.  No physical exam could be performed with this format.  Please refer to the patient's chart for his  consent to telehealth for [facility].   Date:  01/27/2020   ID:  Jesus Williams, Jesus Williams [birth year], MRN [id number]  Patient Location: Home Provider Location: Office  PCP:  System, Provider Not In  Cardiologist:  Kate Sable, MD  Electrophysiologist:  None   Evaluation Performed:  Follow-Up Visit  Chief Complaint: Hospital Follow-up  History of Present Illness:    Jesus Williams is a 59 y.o. male with past medical history of Type 2 DM, HLD, OSA (previously intolerant to CPAP), ESRD and chronic anemia who presents for a hospital follow-up telehealth visit.  He had been admitted for COVID-19 PNA from 12/06/2019 to 12/11/2019 and was treated with Dexamethasone and Remdesivir. He presented back to the ED on 12/27/2019 and reported worsening dyspnea and decrease in oxygen saturations with activity. He also reported discomfort along his chest which would improve with rest. HS Troponin values were found to be elevated, peaking at 6317 which was consistent with an NSTEMI. Echocardiogram showed a preserved EF of 55 to 60% with trivial MR and grade 1 diastolic dysfunction. He was transferred to [facility] for a cardiac  catheterization and this was performed by Dr. Irish Lack on 12/28/2019 and showed nonobstructive CAD. His enzyme elevation was thought to possibly be secondary to myocarditis in the setting of recent Covid infection. He was continued on ASA, statin and beta-blocker therapy with plans for a repeat echocardiogram in 2 to 3 months to exclude systolic dysfunction.  In talking with the patient today, he reports overall doing well since his most recent hospitalization. He denies any recurrent episodes of chest pain. His breathing is almost back to baseline and he has been using his BiPAP at night consistently. He denies any recent orthopnea, PND or lower extremity edema. His energy and strength continue to improve following his recurrent hospitalizations.   Past Medical History:  Diagnosis Date  . Chronic respiratory failure ([place])    a. diagnosed with COPD, started on O2 in 2016.  . Diabetes mellitus ([place])    a. a1c 7.0 on 11/2019.  Marland Kitchen ESRD on hemodialysis ([place])   . Gout   . Hyperlipidemia    mixed. Fair control with current meds.  . Hypertension   . Mild anemia   . Morbid obesity ([place])    Pt followed by a Nutrition Clinic.  Marland Kitchen OSA (obstructive sleep apnea)   . Pneumonia due to COVID-19 virus    Admitted 11/2019-12/2019   Past Surgical History:  Procedure Laterality Date  . AV FISTULA INSERTION W/ RF MAGNETIC GUIDANCE    . LEFT HEART CATH AND CORONARY ANGIOGRAPHY N/A 12/28/2019   Procedure: LEFT HEART CATH AND CORONARY ANGIOGRAPHY;  Surgeon: Jettie Booze, MD;  Location: [facility];  Service: Cardiovascular;  Laterality:  N/A;     Current Meds  Medication Sig  . albuterol (VENTOLIN HFA) 108 (90 Base) MCG/ACT inhaler Inhale 2 puffs into the lungs every 6 (six) hours as needed for wheezing or shortness of breath.  . allopurinol (ZYLOPRIM) 100 MG tablet Take 100 mg by mouth daily.  Marland Kitchen ascorbic acid (VITAMIN C) 500 MG tablet Take 1 tablet (500 mg total) by mouth daily. X 5 days  .  aspirin 81 MG chewable tablet Chew 1 tablet by mouth daily.  Marland Kitchen atorvastatin (LIPITOR) 80 MG tablet Take 1 tablet by mouth daily.  . carvedilol (COREG) 25 MG tablet Take 25 mg by mouth 2 (two) times daily with a meal.  . insulin aspart protamine- aspart (NOVOLOG MIX 70/30) (70-30) 100 UNIT/ML injection Inject 42 Units into the skin 2 (two) times daily.  . sevelamer carbonate (RENVELA) 800 MG tablet Take 800 mg by mouth 3 (three) times daily with meals.  . zinc sulfate 220 (50 Zn) MG capsule Take 1 capsule (220 mg total) by mouth daily. X 5 days     Allergies:   No known allergies   Social History   Tobacco Use  . Smoking status: Never Smoker  . Smokeless tobacco: Never Used  Substance Use Topics  . Alcohol use: Not Currently    Comment: very little, once every 6 months  . Drug use: Never     Family Hx: The patient's family history includes Hypertension in his mother.  ROS:   Please see the history of present illness.     All other systems reviewed and are negative.   Prior CV studies:   The following studies were reviewed today:  Echocardiogram: 12/2019 IMPRESSIONS    1. Left ventricular ejection fraction, by visual estimation, is 55 to  60%. The left ventricle has normal function. Unable to assess regional  wall motion.  2. Global right ventricle has normal systolc function.The right  ventricular size is mildly enlarged. Right ventricular wall thickness was  not assessed.  3. Mild mitral annular calcification.  4. The mitral valve is grossly normal. Trivial mitral valve  regurgitation.  5. No evidence of aortic valve sclerosis or stenosis.  6. The inferior vena cava is dilated in size with <50% respiratory  variability, suggesting right atrial pressure of 15 mmHg.  7. Left ventricular diastolic parameters are consistent with Grade I  diastolic dysfunction (impaired relaxation).   Cardiac Catheterization: 12/2019  Mid RCA to Dist RCA lesion is 25%  stenosed.  Mid LAD lesion is 25% stenosed.  2nd Diag lesion is 25% stenosed.  LV end diastolic pressure is normal.  There is no aortic valve stenosis.   Nonobstructive CAD.  Continue aggressive preventive therapy.   Possible myocarditis as cause of elevated troponin in the setting of COVID.   Labs/Other Tests and Data Reviewed:    EKG:  An ECG dated 01/03/2020 was personally reviewed today and demonstrated: NSR, HR 99 with known RBBB.   Recent Labs: 12/29/2019: Magnesium 2.0 01/03/2020: ALT 46; B Natriuretic Peptide 500.0; BUN 62; Creatinine, Ser 13.34; Hemoglobin 9.7; Platelets 304; Potassium 5.2; Sodium 139   Recent Lipid Panel Lab Results  Component Value Date/Time   CHOL 84 12/28/2019 05:05 AM   TRIG 98 12/28/2019 05:05 AM   HDL 32 (L) 12/28/2019 05:05 AM   CHOLHDL 2.6 12/28/2019 05:05 AM   LDLCALC 32 12/28/2019 05:05 AM    Wt Readings from Last 3 Encounters:  01/27/20 297 lb (134.7 kg)  01/03/20 287 lb (130.2 kg)  12/29/19 286 lb 9.6 oz (130 kg)     Objective:    Vital Signs:  Ht 5\' 6"  (1.676 m)   Wt 297 lb (134.7 kg)   BMI 47.94 kg/m    General: Pleasant male sounding in NAD Psych: Normal affect. Neuro: Alert and oriented X 3.  Lungs:  Resp regular and unlabored while talking on the phone.    ASSESSMENT & PLAN:    1. Myocarditis due to COVID-19 - The patient was recently admitted with an NSTEMI following prior Covid-19 diagnosis and cardiac catheterization showed nonobstructive CAD as outlined above. It was thought that myocarditis could have been the cause of his initial symptoms and enzyme elevation. - He denies any recurrent chest pain and breathing is back to baseline. Will plan for a repeat echocardiogram in 3 months as recommended at the time of his hospitalization.   2. CAD - catheterization last month showed scattered 25% stenoses as outlined above. Continue ASA, beta-blocker and statin therapy.  3. HTN - BP was well controlled at 120/85 on  most recent check. Continue Coreg 25 mg twice daily. He does hold his AM dosing on the morning of HD sessions to avoid worsening hypotension.  4. HLD - FLP during recent admission showed total cholesterol 84, triglycerides 98, HDL 32 and LDL 32. Continue Atorvastatin 80 mg daily.  5. ESRD - on HD - MWF schedule.   6. OSA - continued compliance with BiPAP encouraged.   COVID-19 Education: The signs and symptoms of COVID-19 were discussed with the patient and how to seek care for testing (follow up with PCP or arrange E-visit). The patient has already been hospitalized for COVID-19 as outlined above.   Time:   Today, I have spent 13 minutes with the patient with telehealth technology discussing the above problems.     Medication Adjustments/Labs and Tests Ordered: Current medicines are reviewed at length with the patient today.  Concerns regarding medicines are outlined above.   Tests Ordered: No orders of the defined types were placed in this encounter.   Medication Changes: No orders of the defined types were placed in this encounter.   Follow Up:  In Person in 5-6 month(s)  Signed, Erma Heritage, PA-C  01/27/2020 2:11 PM    [facility]

## 2020-03-09 DEATH — deceased

## 2020-04-26 ENCOUNTER — Other Ambulatory Visit: Payer: Medicare Other

## 2020-04-27 ENCOUNTER — Other Ambulatory Visit: Payer: Medicare Other

## 2020-07-05 ENCOUNTER — Ambulatory Visit: Payer: Medicare Other | Admitting: Cardiovascular Disease

## 2020-11-13 IMAGING — DX DG CHEST 1V PORT
2 series · 2 of 2 positions shown · non-contrast
Comparison: 04/23/2013

CLINICAL DATA: Shortness of breath

EXAM:
PORTABLE CHEST 1 VIEW

[chest ap grid (1 of 2)]
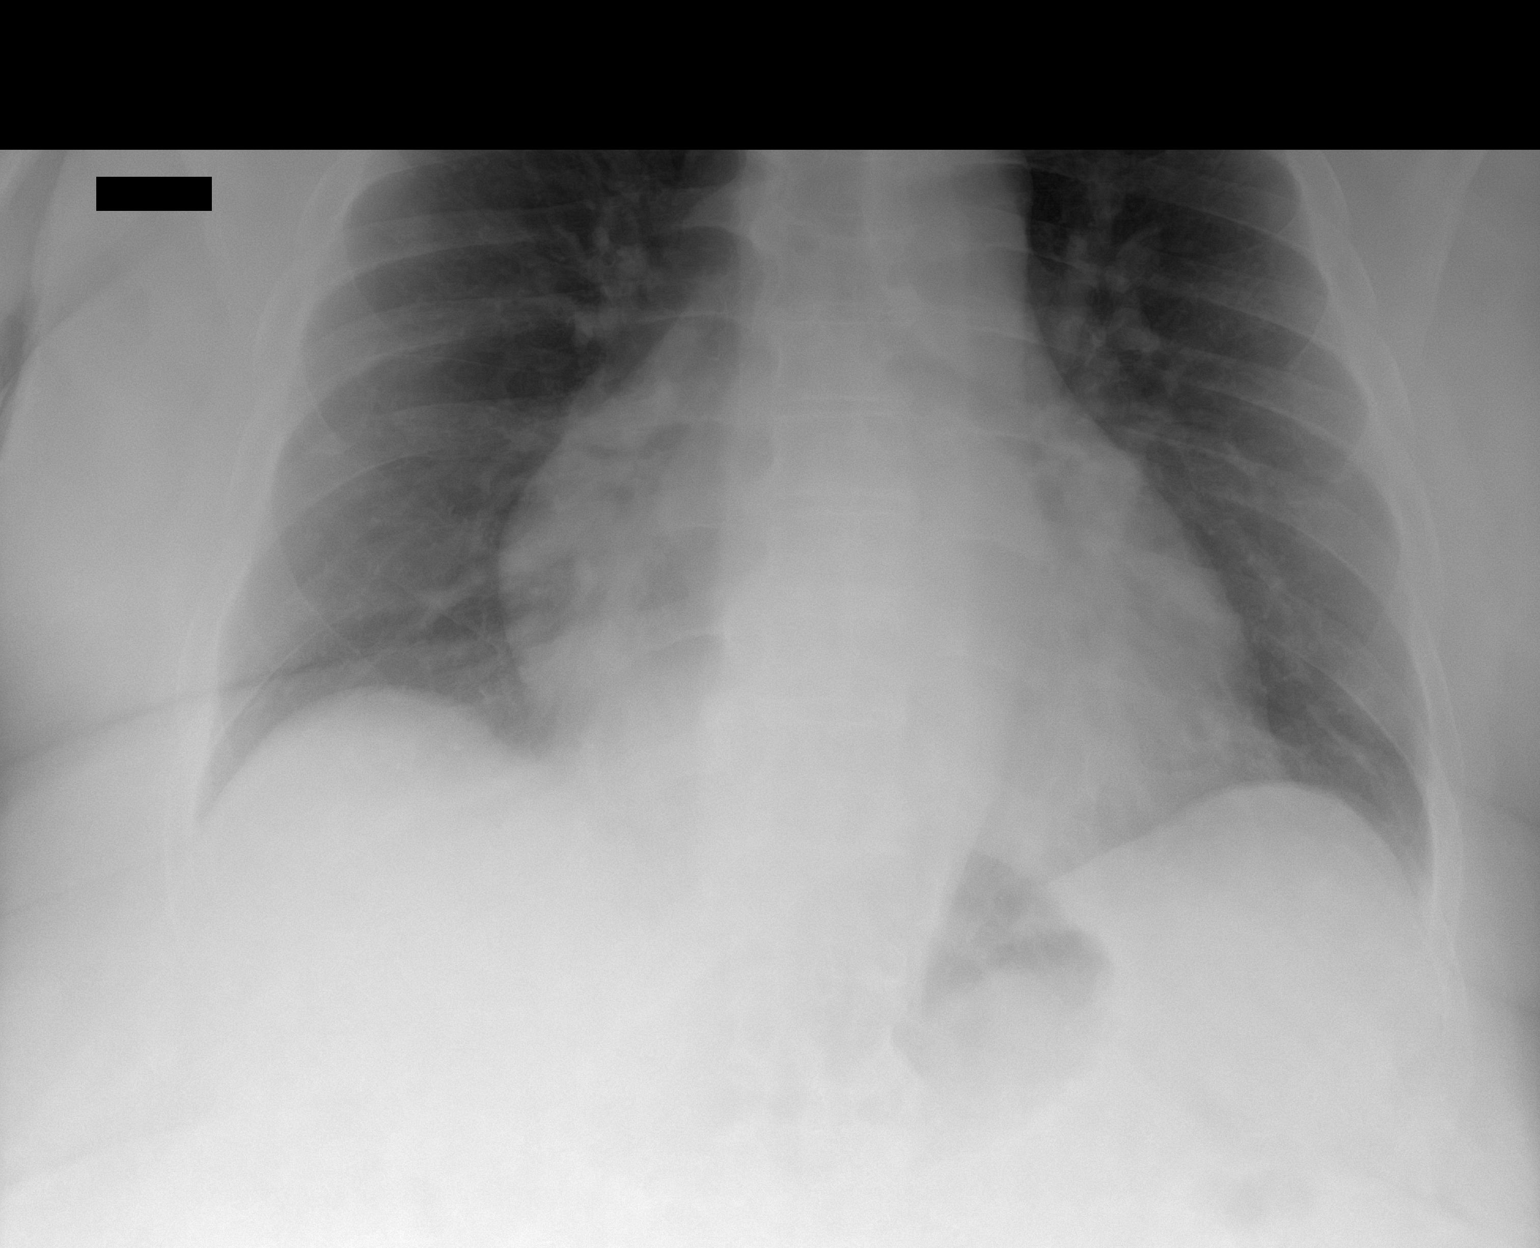

[chest ap grid (2 of 2)]
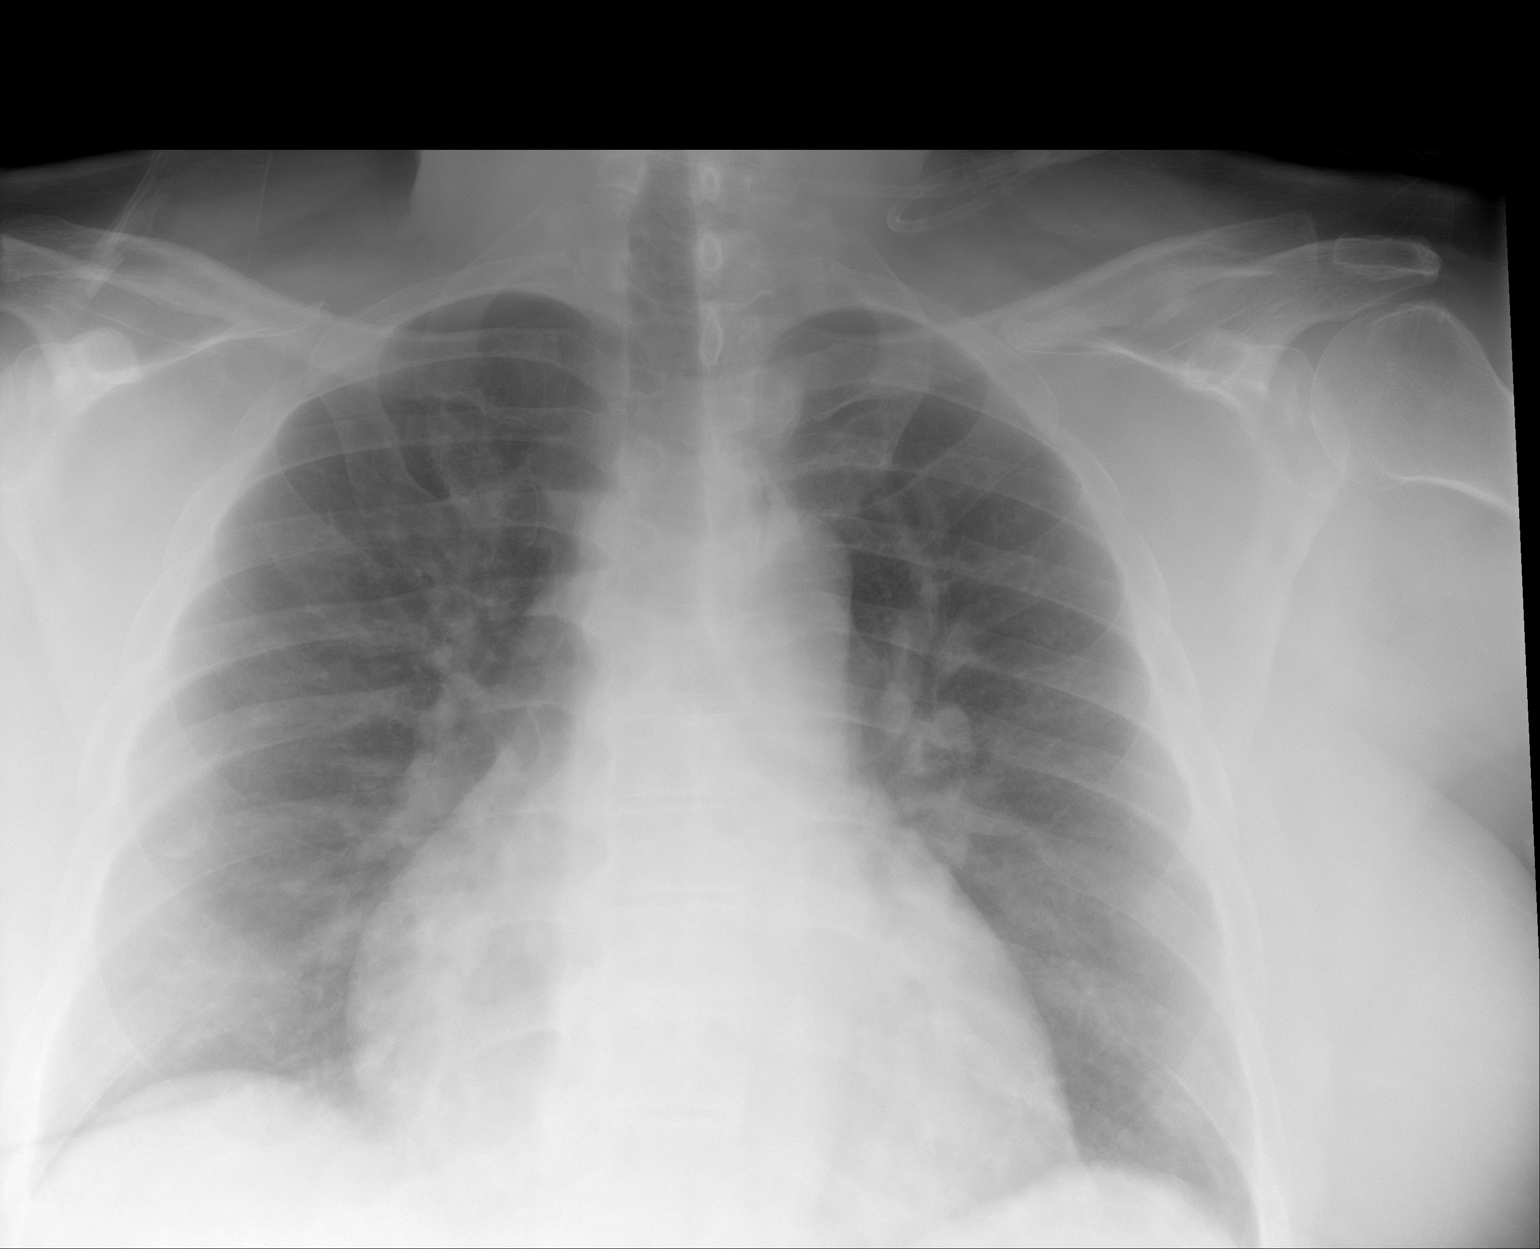

[2 of 2 positions shown; findings below may reference images not displayed]

FINDINGS: Cardiomegaly with mild central congestion. No pleural effusion or
overt pulmonary edema. No pneumothorax.
IMPRESSION: Cardiomegaly with mild central congestion. No definite focal
airspace disease.

## 2020-11-14 IMAGING — DX DG CHEST 1V PORT
1 series · 1 of 1 positions shown · non-contrast
Comparison: 12/06/2019

CLINICAL DATA: COVID positive

EXAM:
PORTABLE CHEST 1 VIEW

[chest ap]
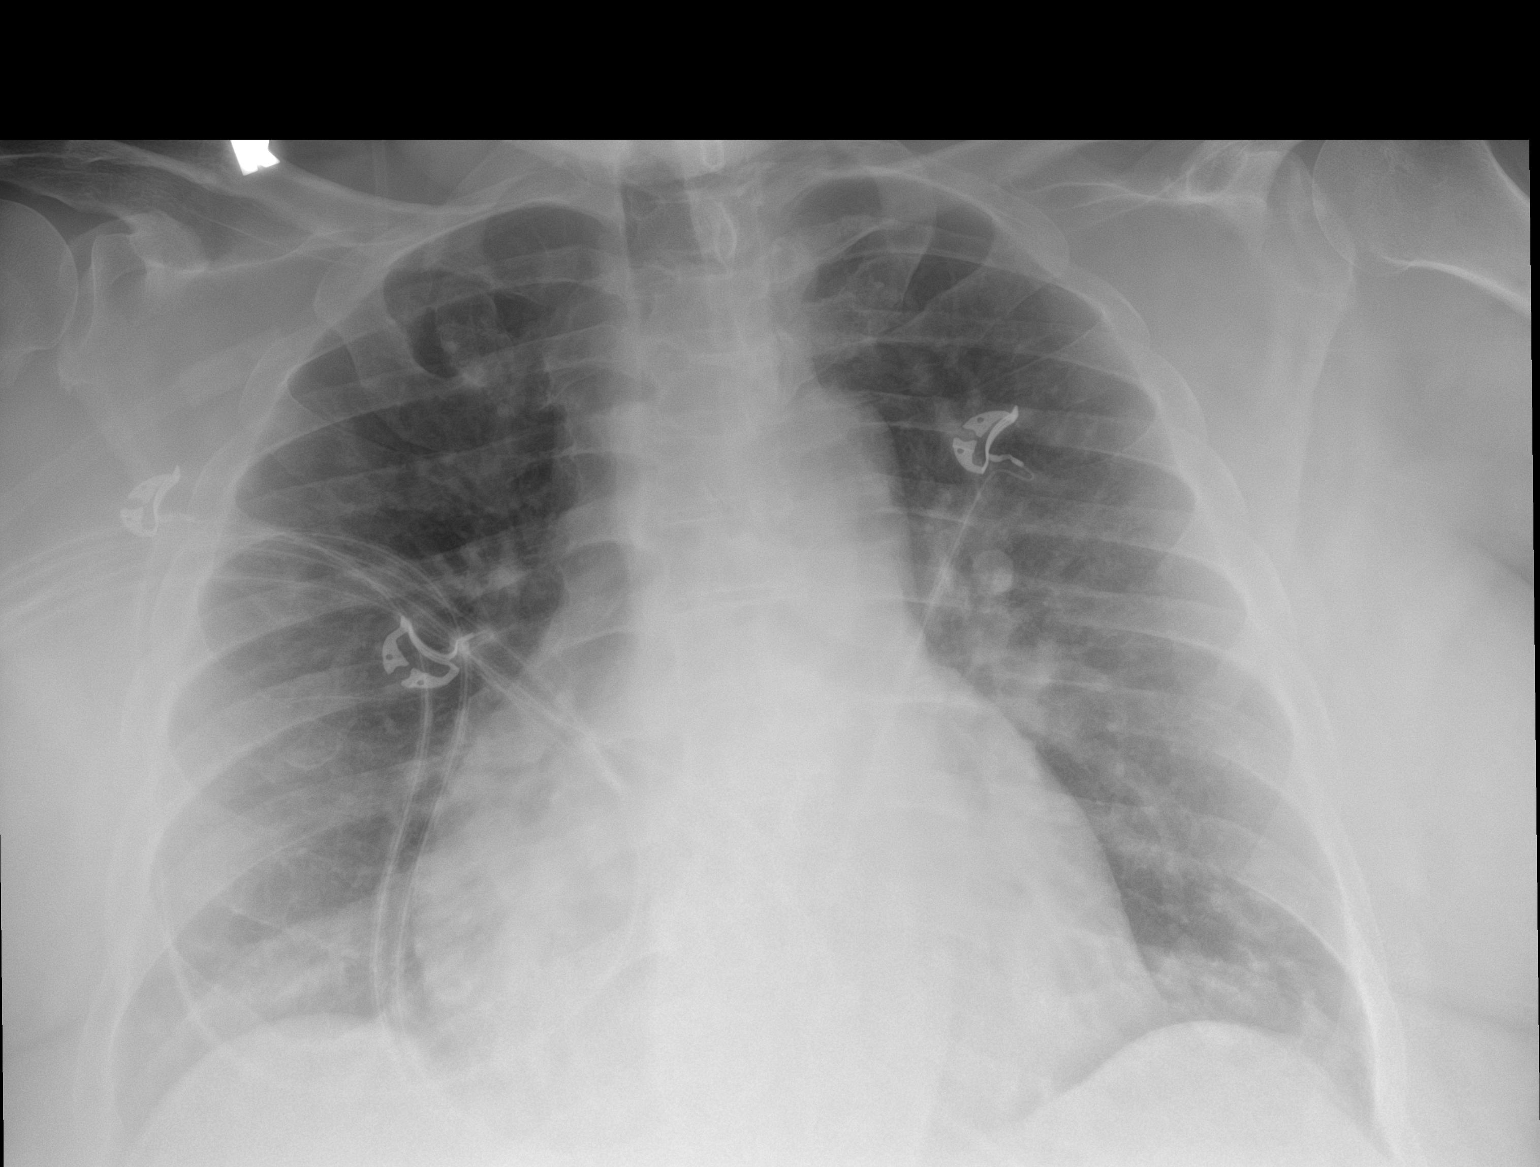

[1 of 1 positions shown; findings below may reference images not displayed]

FINDINGS: Cardiomegaly. There may be subtle heterogeneous airspace opacity in
the bilateral lung bases. Osseous structures are unremarkable.
IMPRESSION: 1. There may be subtle heterogeneous airspace opacity in the
bilateral lung bases, generally in keeping with [id number] airspace
disease. This appears new or increased compared to prior
radiographs. PA and lateral radiographs or CT may be helpful to
further evaluate.

2.  Cardiomegaly.

## 2020-12-04 IMAGING — DX DG CHEST 1V PORT
2 series · 2 of 2 positions shown · non-contrast
Comparison: 12/09/2019 and earlier.

CLINICAL DATA: Follow-up [id number] pneumonia.

EXAM:
PORTABLE CHEST 1 VIEW

[chest ap (1 of 2)]
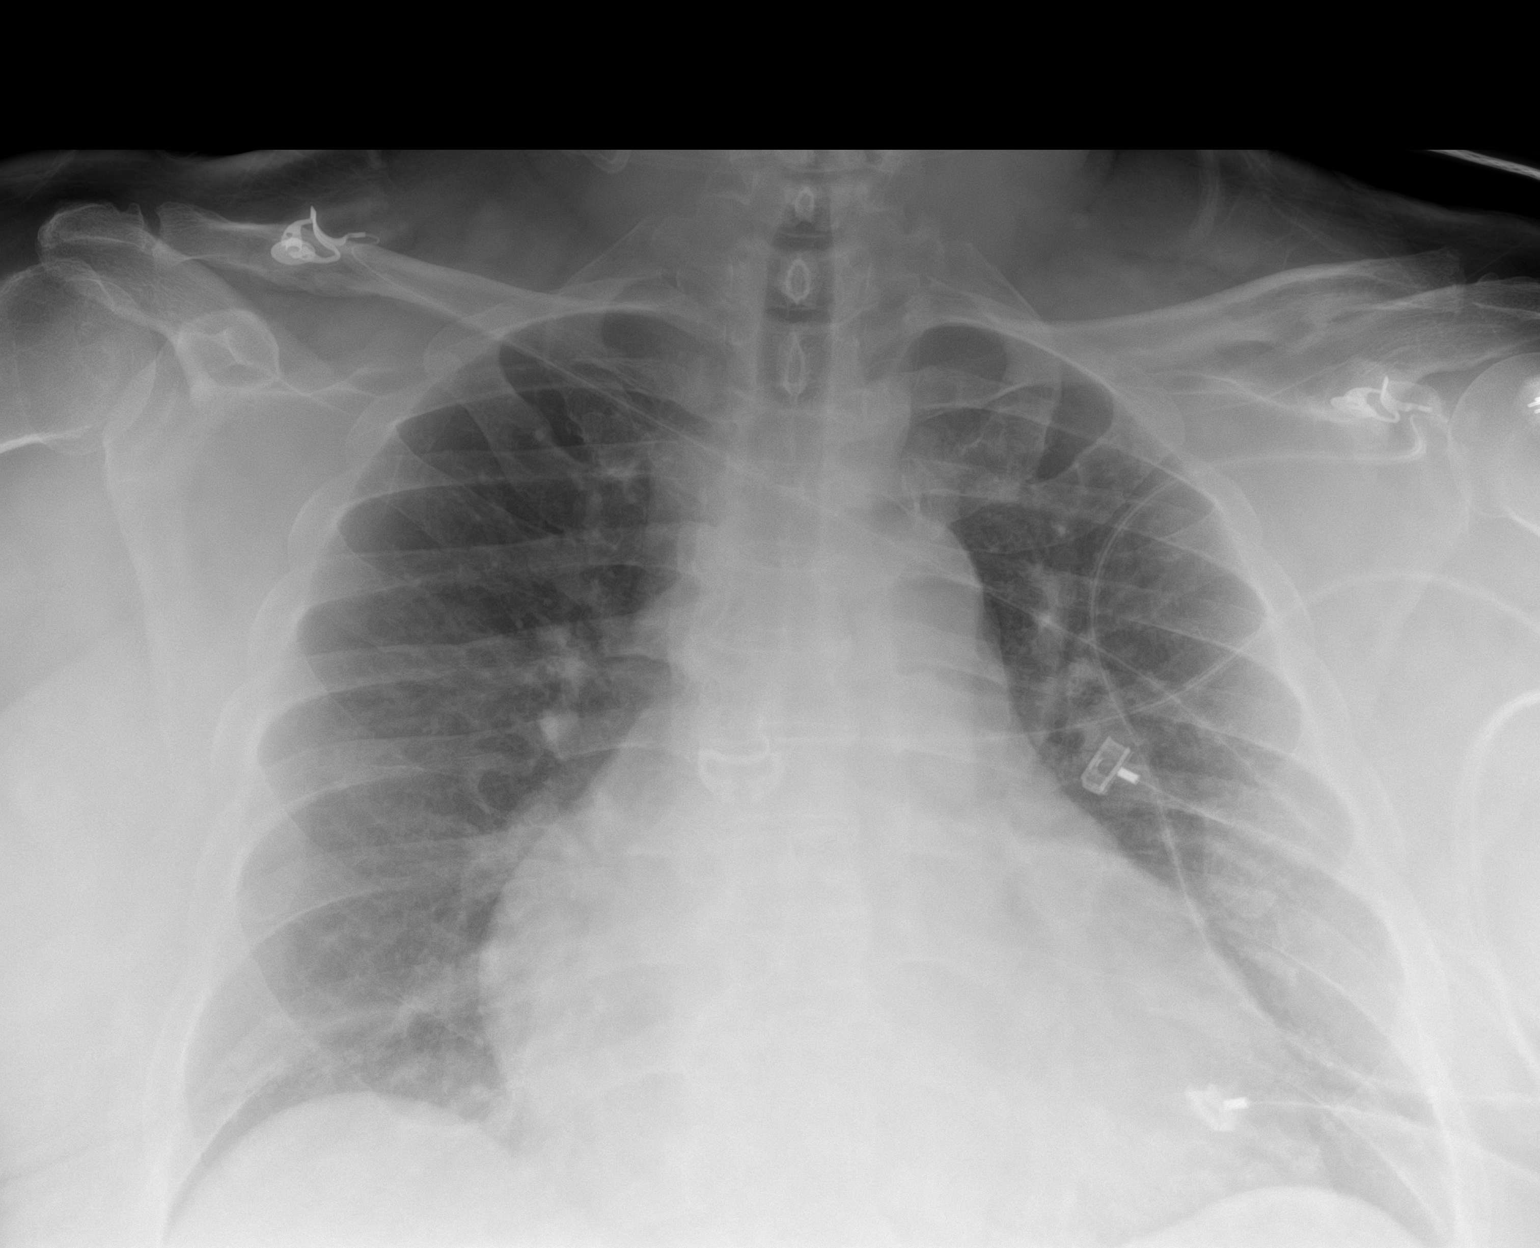

[chest ap (2 of 2)]
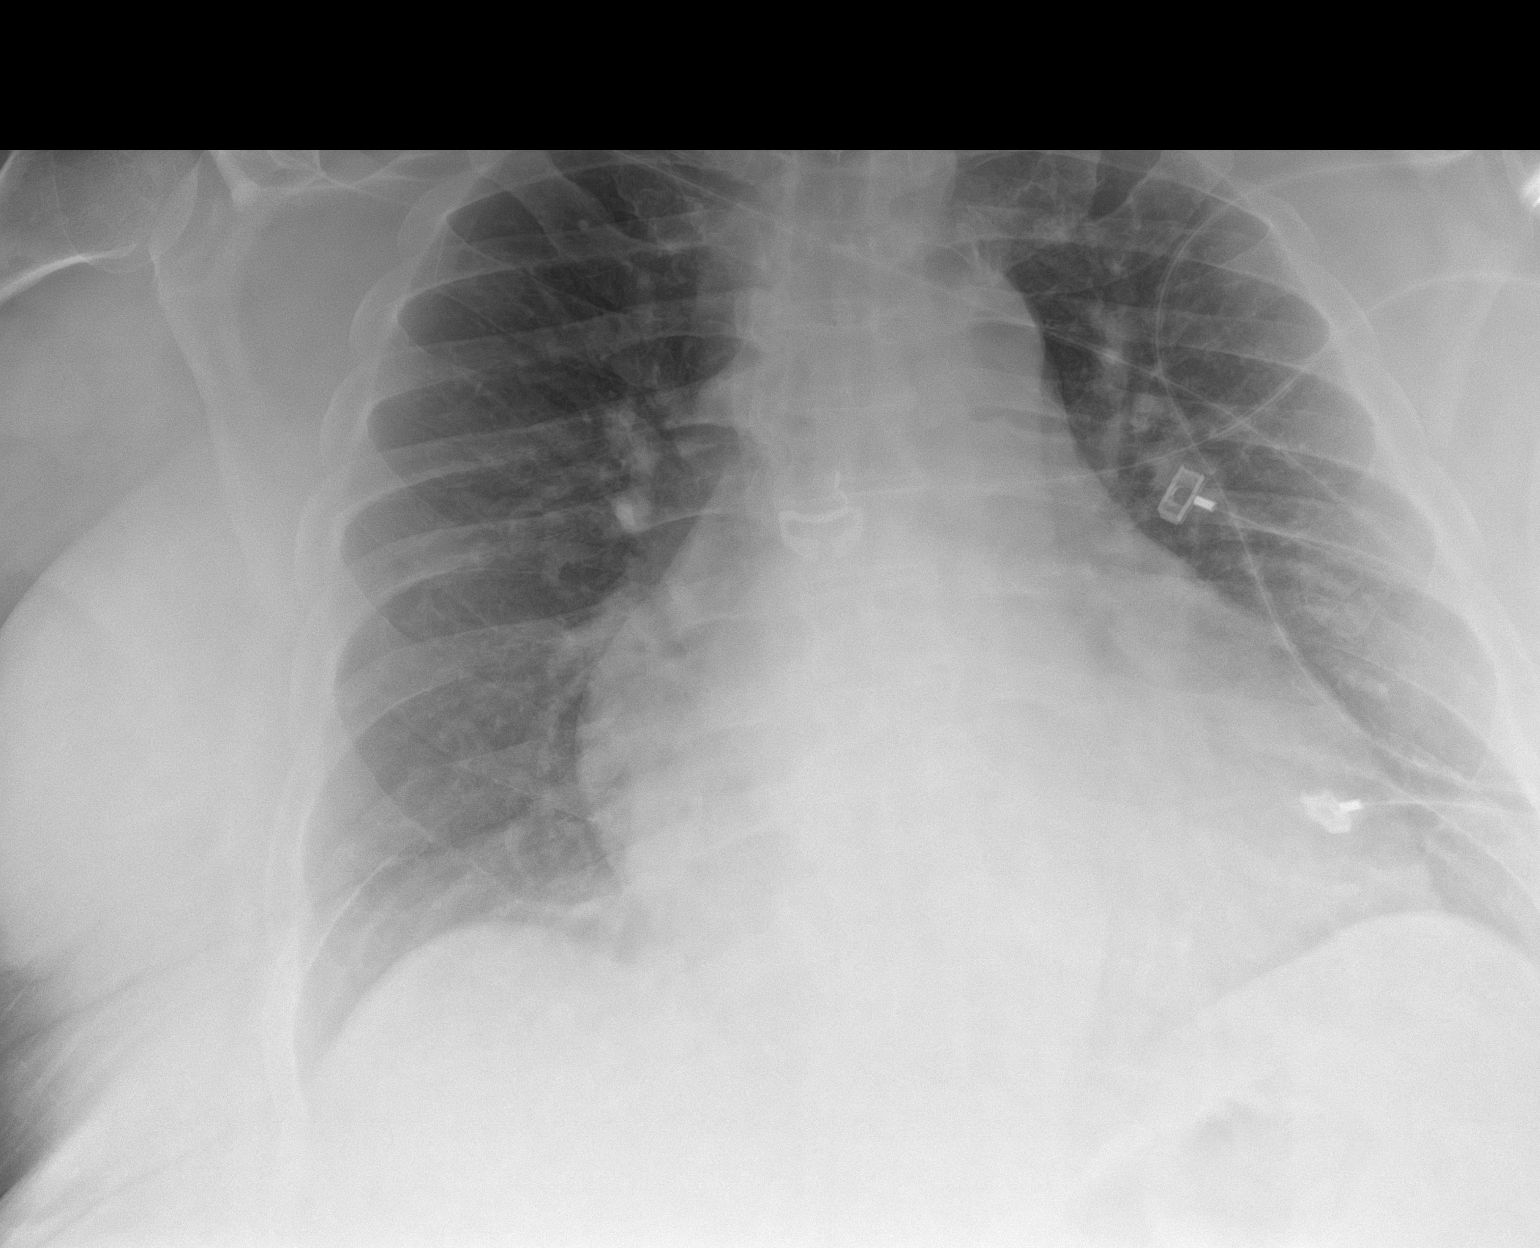

[2 of 2 positions shown; findings below may reference images not displayed]

FINDINGS: Cardiac silhouette markedly enlarged, unchanged. Previously
identified patchy ground-glass airspace opacities in the lung bases
have resolved. Lungs now clear. Pulmonary vascularity normal without
evidence of pulmonary edema. No visible pleural effusions.
IMPRESSION: Resolution of the previously identified pneumonia involving the lung
bases. No acute cardiopulmonary disease currently.
